# Patient Record
Sex: Female | Born: 1955
Health system: Southern US, Community
[De-identification: ages and names within clinical notes are randomized; demographics above are authoritative.]

## PROBLEM LIST (undated history)

## (undated) DIAGNOSIS — E559 Vitamin D deficiency, unspecified: Secondary | ICD-10-CM

## (undated) DIAGNOSIS — Z8711 Personal history of peptic ulcer disease: Secondary | ICD-10-CM

## (undated) DIAGNOSIS — K219 Gastro-esophageal reflux disease without esophagitis: Secondary | ICD-10-CM

## (undated) DIAGNOSIS — C801 Malignant (primary) neoplasm, unspecified: Secondary | ICD-10-CM

## (undated) DIAGNOSIS — I341 Nonrheumatic mitral (valve) prolapse: Secondary | ICD-10-CM

## (undated) DIAGNOSIS — Z8719 Personal history of other diseases of the digestive system: Secondary | ICD-10-CM

## (undated) DIAGNOSIS — R87619 Unspecified abnormal cytological findings in specimens from cervix uteri: Secondary | ICD-10-CM

## (undated) DIAGNOSIS — N920 Excessive and frequent menstruation with regular cycle: Secondary | ICD-10-CM

## (undated) DIAGNOSIS — N912 Amenorrhea, unspecified: Secondary | ICD-10-CM

## (undated) HISTORY — DX: Amenorrhea, unspecified: N91.2

## (undated) HISTORY — DX: Vitamin D deficiency, unspecified: E55.9

## (undated) HISTORY — DX: Excessive and frequent menstruation with regular cycle: N92.0

## (undated) HISTORY — DX: Nonrheumatic mitral (valve) prolapse: I34.1

## (undated) HISTORY — DX: Personal history of peptic ulcer disease: Z87.11

## (undated) HISTORY — PX: OTHER SURGICAL HISTORY: SHX169

## (undated) HISTORY — DX: Personal history of other diseases of the digestive system: Z87.19

## (undated) HISTORY — DX: Gastro-esophageal reflux disease without esophagitis: K21.9

## (undated) HISTORY — DX: Unspecified abnormal cytological findings in specimens from cervix uteri: R87.619

## (undated) HISTORY — DX: Malignant (primary) neoplasm, unspecified: C80.1

## (undated) HISTORY — PX: TUBAL LIGATION: SHX77

---

## 1997-01-23 DIAGNOSIS — R87619 Unspecified abnormal cytological findings in specimens from cervix uteri: Secondary | ICD-10-CM

## 1997-01-23 HISTORY — PX: GYNECOLOGIC CRYOSURGERY: SHX857

## 1997-01-23 HISTORY — DX: Unspecified abnormal cytological findings in specimens from cervix uteri: R87.619

## 2003-03-24 DIAGNOSIS — N920 Excessive and frequent menstruation with regular cycle: Secondary | ICD-10-CM

## 2003-03-24 HISTORY — PX: ENDOMETRIAL BIOPSY: SHX622

## 2003-03-24 HISTORY — DX: Excessive and frequent menstruation with regular cycle: N92.0

## 2003-03-31 ENCOUNTER — Other Ambulatory Visit: Admission: RE | Admit: 2003-03-31 | Discharge: 2003-03-31 | Payer: Self-pay | Admitting: Obstetrics and Gynecology

## 2003-06-15 ENCOUNTER — Ambulatory Visit (HOSPITAL_BASED_OUTPATIENT_CLINIC_OR_DEPARTMENT_OTHER): Admission: RE | Admit: 2003-06-15 | Discharge: 2003-06-15 | Payer: Self-pay | Admitting: Obstetrics and Gynecology

## 2003-06-15 ENCOUNTER — Ambulatory Visit (HOSPITAL_COMMUNITY): Admission: RE | Admit: 2003-06-15 | Discharge: 2003-06-15 | Payer: Self-pay | Admitting: Obstetrics and Gynecology

## 2003-06-15 HISTORY — PX: ABLATION: SHX5711

## 2004-02-23 ENCOUNTER — Ambulatory Visit: Payer: Self-pay | Admitting: Internal Medicine

## 2004-02-25 ENCOUNTER — Ambulatory Visit: Payer: Self-pay

## 2005-03-17 ENCOUNTER — Other Ambulatory Visit: Admission: RE | Admit: 2005-03-17 | Discharge: 2005-03-17 | Payer: Self-pay | Admitting: Obstetrics and Gynecology

## 2005-05-02 ENCOUNTER — Encounter: Admission: RE | Admit: 2005-05-02 | Discharge: 2005-05-02 | Payer: Self-pay | Admitting: Obstetrics and Gynecology

## 2006-07-18 ENCOUNTER — Encounter: Admission: RE | Admit: 2006-07-18 | Discharge: 2006-07-18 | Payer: Self-pay | Admitting: Obstetrics and Gynecology

## 2007-01-24 DIAGNOSIS — C801 Malignant (primary) neoplasm, unspecified: Secondary | ICD-10-CM

## 2007-01-24 HISTORY — DX: Malignant (primary) neoplasm, unspecified: C80.1

## 2007-07-17 ENCOUNTER — Other Ambulatory Visit: Admission: RE | Admit: 2007-07-17 | Discharge: 2007-07-17 | Payer: Self-pay | Admitting: Obstetrics and Gynecology

## 2007-08-01 ENCOUNTER — Encounter: Admission: RE | Admit: 2007-08-01 | Discharge: 2007-08-01 | Payer: Self-pay | Admitting: Obstetrics and Gynecology

## 2008-08-19 ENCOUNTER — Encounter: Admission: RE | Admit: 2008-08-19 | Discharge: 2008-08-19 | Payer: Self-pay | Admitting: Obstetrics and Gynecology

## 2008-08-21 ENCOUNTER — Encounter: Admission: RE | Admit: 2008-08-21 | Discharge: 2008-08-21 | Payer: Self-pay | Admitting: Obstetrics and Gynecology

## 2009-01-23 HISTORY — PX: COLPOSCOPY: SHX161

## 2009-08-30 ENCOUNTER — Encounter: Admission: RE | Admit: 2009-08-30 | Discharge: 2009-08-30 | Payer: Self-pay | Admitting: Obstetrics and Gynecology

## 2010-02-13 ENCOUNTER — Encounter: Payer: Self-pay | Admitting: Obstetrics and Gynecology

## 2010-06-10 NOTE — Op Note (Signed)
NAMEHELOISE, Misty Odom                            ACCOUNT NO.:  [id number]   MEDICAL RECORD NO.:  [id number]                   PATIENT TYPE:  AMB   LOCATION:  [facility]                                 FACILITY:  [facility]   PHYSICIAN:  Cynthia P. Romine, M.D.             DATE OF BIRTH:  15-Jul-1955   DATE OF PROCEDURE:  06/15/2003  DATE OF DISCHARGE:                                 OPERATIVE REPORT   POSTOPERATIVE DIAGNOSIS:  Menorrhagia.   POSTOPERATIVE DIAGNOSIS:  Menorrhagia.   PROCEDURES:  Endometrial ablation using the HydroTherm Ablator.   SURGEON:  Dr. Aram Beecham Romine   ANESTHESIA:  General by LMA.   ESTIMATED BLOOD LOSS:  Minimal.   COMPLICATIONS:  None.   DESCRIPTION OF PROCEDURE:  The patient was taken to the operating room and  after the induction of adequate anesthesia by LMA, was placed in the dorsal  lithotomy position and prepped and draped in the usual fashion.  The bladder  was drained with a red rubber catheter.  A posterior weighted and anterior  Simms retractor were placed; the cervix was grasped off the anterior lip  with a single-tooth tenaculum.  The cervix was dilated to a #21 Shawnie Pons.  Attempt was made to place the endometrial ablation scope, and the cervix was  not felt to be dilated enough.  At this point, the tenaculum pulled off, and  the tenaculum was re-placed.  The cervix was dilated to a #27, and the scope  was then easily inserted into the endometrial cavity.  Photographic  documentation was taken preoperatively of the endometrial cavity showing the  tubal ostia for documentation of proper positioning of the scope.  The scope  was withdrawn to the level of the internal os.  An endometrial ablation was  carried out in the standard fashion using the endometrial HydroTherm  Ablator.  There were no complications.  Upon completion of the procedure,  photographic documentation was again taken.  The scope was withdrawn.  When  the tenaculum was removed, there was  some brisk bleeding from the tenaculum  site, and 3 sutures of 0 chromic were used to place at the tenaculum site to  control the bleeding.  Hemostasis was achieved, the instruments removed from  the vagina, and the procedure was terminated.  The patient tolerated it well  and went in satisfactory condition to postanesthesia recovery.                                               Cynthia P. Romine, M.D.    CPR/MEDQ  D:  06/15/2003  T:  06/15/2003  Job:  [id number]

## 2010-08-01 ENCOUNTER — Other Ambulatory Visit: Payer: Self-pay | Admitting: Obstetrics and Gynecology

## 2010-08-01 DIAGNOSIS — Z1231 Encounter for screening mammogram for malignant neoplasm of breast: Secondary | ICD-10-CM

## 2010-09-07 ENCOUNTER — Ambulatory Visit
Admission: RE | Admit: 2010-09-07 | Discharge: 2010-09-07 | Disposition: A | Discharge status: Home / Self Care | Payer: BC Managed Care – PPO | Source: Ambulatory Visit | Attending: Obstetrics and Gynecology | Admitting: Obstetrics and Gynecology

## 2010-09-07 DIAGNOSIS — Z1231 Encounter for screening mammogram for malignant neoplasm of breast: Secondary | ICD-10-CM

## 2011-08-01 ENCOUNTER — Other Ambulatory Visit: Payer: Self-pay | Admitting: Obstetrics and Gynecology

## 2011-08-01 DIAGNOSIS — Z1231 Encounter for screening mammogram for malignant neoplasm of breast: Secondary | ICD-10-CM

## 2011-09-08 ENCOUNTER — Ambulatory Visit
Admission: RE | Admit: 2011-09-08 | Discharge: 2011-09-08 | Disposition: A | Discharge status: Home / Self Care | Payer: BC Managed Care – PPO | Source: Ambulatory Visit | Attending: Obstetrics and Gynecology | Admitting: Obstetrics and Gynecology

## 2011-09-08 DIAGNOSIS — Z1231 Encounter for screening mammogram for malignant neoplasm of breast: Secondary | ICD-10-CM

## 2012-10-26 ENCOUNTER — Other Ambulatory Visit: Payer: Self-pay | Admitting: Obstetrics and Gynecology

## 2012-11-19 ENCOUNTER — Other Ambulatory Visit: Payer: Self-pay

## 2012-11-19 DIAGNOSIS — Z1231 Encounter for screening mammogram for malignant neoplasm of breast: Secondary | ICD-10-CM

## 2012-12-10 ENCOUNTER — Ambulatory Visit: Payer: BC Managed Care – PPO

## 2012-12-23 ENCOUNTER — Ambulatory Visit
Admission: RE | Admit: 2012-12-23 | Discharge: 2012-12-23 | Disposition: A | Discharge status: Home / Self Care | Payer: BC Managed Care – PPO | Source: Ambulatory Visit

## 2012-12-23 DIAGNOSIS — Z1231 Encounter for screening mammogram for malignant neoplasm of breast: Secondary | ICD-10-CM

## 2013-01-10 ENCOUNTER — Encounter: Payer: Self-pay | Admitting: Obstetrics and Gynecology

## 2013-02-28 ENCOUNTER — Other Ambulatory Visit: Payer: Self-pay | Admitting: *Deleted

## 2013-02-28 MED ORDER — PROGESTERONE MICRONIZED 100 MG PO CAPS: 100.0000 mg | ORAL_CAPSULE | Freq: Every day | ORAL | Status: DC

## 2013-02-28 NOTE — Telephone Encounter (Signed)
eScribe request from [facility] for refill on PROGESTERONE Last filled - 03/01/12 X 1 YEAR Last AEX - 03/01/12 Next AEX - 03/10/13 MMG - 12/23/12, normal RX sent.

## 2013-03-05 ENCOUNTER — Ambulatory Visit: Payer: Self-pay | Admitting: Obstetrics and Gynecology

## 2013-03-06 ENCOUNTER — Encounter: Payer: Self-pay | Admitting: Obstetrics and Gynecology

## 2013-03-10 ENCOUNTER — Encounter: Payer: Self-pay | Admitting: Obstetrics and Gynecology

## 2013-03-10 ENCOUNTER — Ambulatory Visit (INDEPENDENT_AMBULATORY_CARE_PROVIDER_SITE_OTHER): Payer: BC Managed Care – PPO | Admitting: Obstetrics and Gynecology

## 2013-03-10 VITALS — BP 113/76 | HR 68 | Resp 14 | Ht 65.0 in | Wt 145.0 lb

## 2013-03-10 DIAGNOSIS — Z01419 Encounter for gynecological examination (general) (routine) without abnormal findings: Secondary | ICD-10-CM

## 2013-03-10 DIAGNOSIS — Z0000 Encounter for general adult medical examination without abnormal findings: Secondary | ICD-10-CM

## 2013-03-10 LAB — POCT URINALYSIS DIPSTICK
Bilirubin, UA: NEGATIVE
Blood, UA: NEGATIVE
Glucose, UA: NEGATIVE
Ketones, UA: NEGATIVE
LEUKOCYTES UA: NEGATIVE
NITRITE UA: NEGATIVE
Protein, UA: NEGATIVE
Urobilinogen, UA: NEGATIVE
pH, UA: 5

## 2013-03-10 LAB — HEMOGLOBIN, FINGERSTICK: HEMOGLOBIN, FINGERSTICK: 14.2 g/dL (ref 12.0–16.0)

## 2013-03-10 MED ORDER — ESTRADIOL 0.0375 MG/24HR TD PTTW: 1.0000 | MEDICATED_PATCH | TRANSDERMAL | Status: DC

## 2013-03-10 MED ORDER — PROGESTERONE MICRONIZED 100 MG PO CAPS: 100.0000 mg | ORAL_CAPSULE | Freq: Every day | ORAL | Status: DC

## 2013-03-10 NOTE — Progress Notes (Signed)
GYNECOLOGY VISIT  PCP: [place]  Referring provider:   HPI: 58 y.o.   Married  Caucasian  female   G2P2 with No LMP recorded. Patient is postmenopausal.   here for  Annual Exam  On HRT for four years. Started for hot flashes.  I feel human on hormone therapy.  No vagina dryness.  Became a grandmother.   Hgb:  14.2, labs with PCP.  Urine:  Neg  GYNECOLOGIC HISTORY: No LMP recorded. Patient is postmenopausal. Sexually active:  yes Partner preference: female Contraception:   BTL, Menopausal Menopausal hormone therapy: yes    Vivelle Dot, Prometrium DES exposure:   no Blood transfusions:   no Sexually transmitted diseases:   no GYN Procedures:  Ablation (HTA)  Colpo/BX 02/23/2010 - squamous epithelium with HPV effect, 01/27/2009 - LGSIL, ECC with fragment of atypia. Mammogram:   12/23/2012 Neg              Pap:   03/01/12 Neg  HR HPV Neg History of abnormal pap smear:  12/2008 LSIL  HPV/Mild Dysplasia/CIN 1, 01/26/2010  LSIL.  Remote history of cryotherapy 1999.    OB History   Grav Para Term Preterm Abortions TAB SAB Ect Mult Living   2 2        2        LIFESTYLE: Exercise:    Runner 3 miles 3 days a week, Yoga             Tobacco: no Alcohol: 1-2 drinks a week (Alcohol or Wine) Drug use:  no  OTHER HEALTH MAINTENANCE: Tetanus/TDap: 12/23/2008 Gardisil: no Influenza:  No, declines.  Zostavax: no  Bone density: never Colonoscopy: 02/2008 normal, repeat in 10years  Cholesterol check: no  Family History  Problem Relation Age of Onset  . Hypertension Mother   . Anemia Mother   . Heart disease Father     There are no active problems to display for this patient.  Past Medical History  Diagnosis Date  . MVP (mitral valve prolapse)   . Abnormal Pap smear of cervix 1999    Cryo  . GERD (gastroesophageal reflux disease)   . Cancer 2009    Sq cell cancer Left face  . Amenorrhea   . Menorrhagia 3/05    Past Surgical History  Procedure Laterality Date   . Gynecologic cryosurgery  1999  . Ablation  06/15/03    HTA    . Colposcopy  01/2009    CIN 1  . Tubal ligation      BTL  . Endometrial biopsy  03/2003    benign  . Birth mark removed      foot    ALLERGIES: Review of patient's allergies indicates no known allergies.  Current Outpatient Prescriptions  Medication Sig Dispense Refill  . Cholecalciferol (VITAMIN D) 2000 UNITS tablet Take 2,000 Units by mouth daily.      . cycloSPORINE (RESTASIS) 0.05 % ophthalmic emulsion 1 drop 2 (two) times daily.      Marland Kitchen dexlansoprazole (DEXILANT) 60 MG capsule Take 60 mg by mouth 2 (two) times a week.      . estradiol (VIVELLE-DOT) 0.0375 MG/24HR Place 1 patch onto the skin 2 (two) times a week.      . progesterone (PROMETRIUM) 100 MG capsule Take 1 capsule (100 mg total) by mouth daily.  90 capsule  0  . Cetirizine HCl (ZYRTEC PO) Take by mouth daily.       No current facility-administered medications for this visit.  ROS:  Pertinent items are noted in HPI.  SOCIAL HISTORY:  At home.   PHYSICAL EXAMINATION:    BP 113/76  Pulse 68  Resp 14  Ht 5\' 5"  (1.651 m)  Wt 145 lb (65.772 kg)  BMI 24.13 kg/m2   Wt Readings from Last 3 Encounters:  03/10/13 145 lb (65.772 kg)     Ht Readings from Last 3 Encounters:  03/10/13 5\' 5"  (1.651 m)    General appearance: alert, cooperative and appears stated age Head: Normocephalic, without obvious abnormality, atraumatic Neck: no adenopathy, supple, symmetrical, trachea midline and thyroid not enlarged, symmetric, no tenderness/mass/nodules Lungs: clear to auscultation bilaterally Breasts: Inspection negative, No nipple retraction or dimpling, No nipple discharge or bleeding, No axillary or supraclavicular adenopathy, Normal to palpation without dominant masses Heart: regular rate and rhythm Abdomen: soft, non-tender; no masses,  no organomegaly Extremities: extremities normal, atraumatic, no cyanosis or edema Skin: Skin color, texture, turgor  normal. No rashes or lesions Lymph nodes: Cervical, supraclavicular, and axillary nodes normal. No abnormal inguinal nodes palpated Neurologic: Grossly normal  Pelvic: External genitalia:  no lesions              Urethra:  normal appearing urethra with no masses, tenderness or lesions              Bartholins and Skenes: normal                 Vagina: normal appearing vagina with normal color and discharge, no lesions              Cervix: normal appearance              Pap and high risk HPV testing done: yes.            Bimanual Exam:  Uterus:  uterus is normal size, shape, consistency and nontender                                      Adnexa: normal adnexa in size, nontender and no masses                                      Rectovaginal: Confirms                                      Anus:  normal sphincter tone, no lesions  ASSESSMENT  Normal gynecologic exam. History of LGSIL in 2012.   History of prior cryotherapy.   PLAN  Mammogram yearly.  Pap smear and high risk HPV testing performed today.  Counseled on use and side effects of HRT, diet and exercise.  Discussed risks of HRT - Stroke, MI, DVT, PE, breast cancer.  Continue with Vivelle 0.0375 twice weekly and Prometrium 100 mg daily.  Medications per Epic orders Return annually or prn   An After Visit Summary was printed and given to the patient.

## 2013-03-10 NOTE — Patient Instructions (Signed)

## 2013-03-14 LAB — IPS PAP TEST WITH HPV

## 2013-11-17 ENCOUNTER — Telehealth: Payer: Self-pay | Admitting: Obstetrics and Gynecology

## 2013-11-17 NOTE — Telephone Encounter (Signed)
lmtcb to reschedule aex from 03/16/14 to another day

## 2013-11-24 ENCOUNTER — Encounter: Payer: Self-pay | Admitting: Obstetrics and Gynecology

## 2014-01-26 ENCOUNTER — Other Ambulatory Visit: Payer: Self-pay

## 2014-01-26 DIAGNOSIS — Z1231 Encounter for screening mammogram for malignant neoplasm of breast: Secondary | ICD-10-CM

## 2014-02-04 ENCOUNTER — Ambulatory Visit
Admission: RE | Admit: 2014-02-04 | Discharge: 2014-02-04 | Disposition: A | Discharge status: Home / Self Care | Payer: BLUE CROSS/BLUE SHIELD | Source: Ambulatory Visit

## 2014-02-04 DIAGNOSIS — Z1231 Encounter for screening mammogram for malignant neoplasm of breast: Secondary | ICD-10-CM

## 2014-03-16 ENCOUNTER — Ambulatory Visit: Payer: BC Managed Care – PPO | Admitting: Obstetrics and Gynecology

## 2014-03-16 ENCOUNTER — Encounter: Payer: Self-pay | Admitting: Certified Nurse Midwife

## 2014-03-16 ENCOUNTER — Ambulatory Visit (INDEPENDENT_AMBULATORY_CARE_PROVIDER_SITE_OTHER): Payer: BLUE CROSS/BLUE SHIELD | Admitting: Certified Nurse Midwife

## 2014-03-16 VITALS — BP 128/80 | HR 72 | Resp 16 | Ht 65.0 in | Wt 147.0 lb

## 2014-03-16 DIAGNOSIS — N951 Menopausal and female climacteric states: Secondary | ICD-10-CM

## 2014-03-16 DIAGNOSIS — Z01419 Encounter for gynecological examination (general) (routine) without abnormal findings: Secondary | ICD-10-CM

## 2014-03-16 DIAGNOSIS — N390 Urinary tract infection, site not specified: Secondary | ICD-10-CM

## 2014-03-16 DIAGNOSIS — Z0000 Encounter for general adult medical examination without abnormal findings: Secondary | ICD-10-CM

## 2014-03-16 LAB — POCT URINALYSIS DIPSTICK
BILIRUBIN UA: NEGATIVE
Glucose, UA: NEGATIVE
Ketones, UA: NEGATIVE
Nitrite, UA: NEGATIVE
Protein, UA: NEGATIVE
UROBILINOGEN UA: NEGATIVE
pH, UA: 5

## 2014-03-16 MED ORDER — ESTRADIOL 0.0375 MG/24HR TD PTTW: 1.0000 | MEDICATED_PATCH | TRANSDERMAL | Status: DC

## 2014-03-16 MED ORDER — PROGESTERONE MICRONIZED 100 MG PO CAPS: 100.0000 mg | ORAL_CAPSULE | Freq: Every day | ORAL | Status: DC

## 2014-03-16 NOTE — Progress Notes (Signed)
59 y.o. G2P2 Married  Caucasian Fe here for annual exam. Menopausal on HRT. Would like to discuss HRT and length of time she needs to continue. Sees PCP prn,? Labs last visit will check.Denies any vaginal bleeding or vaginal dryness. Denies any urinary frequency, urgency or pain. No other health issues today.  No LMP recorded. Patient is postmenopausal.          Sexually active: Yes.    The current method of family planning is tubal ligation and post menopausal status.    Exercising: Yes.    Run and yoga Smoker:  no  Health Maintenance: Pap:  03/10/13 Neg. HR HPV:neg Hx of Abnormal pap and colposcopy 2011 MMG:  02/05/14 BIRADS1:neg Self Breast Exam: No Colonoscopy:  02/2008 BMD:   none TDaP:  2010 Labs: UA: WBC=Trace, RBC=Trace Ph=5.0 Hg:14.2   reports that she has never smoked. She has never used smokeless tobacco. She reports that she drinks about 1.0 oz of alcohol per week. She reports that she does not use illicit drugs.  Past Medical History  Diagnosis Date  . MVP (mitral valve prolapse)   . Abnormal Pap smear of cervix 1999    Cryo  . GERD (gastroesophageal reflux disease)   . Cancer 2009    Sq cell cancer Left face  . Amenorrhea   . Menorrhagia 3/05    Past Surgical History  Procedure Laterality Date  . Gynecologic cryosurgery  1999  . Ablation  06/15/03    HTA    . Colposcopy  01/2009    CIN 1  . Tubal ligation      BTL  . Endometrial biopsy  03/2003    benign  . Birth mark removed      foot    Current Outpatient Prescriptions  Medication Sig Dispense Refill  . cycloSPORINE (RESTASIS) 0.05 % ophthalmic emulsion 1 drop 2 (two) times daily.    Marland Kitchen dexlansoprazole (DEXILANT) 60 MG capsule Take 60 mg by mouth 2 (two) times a week.    . estradiol (VIVELLE-DOT) 0.0375 MG/24HR Place 1 patch onto the skin 2 (two) times a week. 8 patch 11  . progesterone (PROMETRIUM) 100 MG capsule Take 1 capsule (100 mg total) by mouth daily. 90 capsule 3  . tetracycline  (ACHROMYCIN,SUMYCIN) 500 MG capsule Take 500 mg by mouth every other day.     No current facility-administered medications for this visit.    Family History  Problem Relation Age of Onset  . Hypertension Mother   . Anemia Mother   . Heart disease Mother   . Heart disease Father   . Cancer Brother     Satge 4 Sarcoma    ROS:  Pertinent items are noted in HPI.  Otherwise, a comprehensive ROS was negative.  Exam:   BP 128/80 mmHg  Pulse 72  Resp 16  Ht 5\' 5"  (1.651 m)  Wt 147 lb (66.679 kg)  BMI 24.46 kg/m2 Height: 5\' 5"  (165.1 cm) Ht Readings from Last 3 Encounters:  03/16/14 5\' 5"  (1.651 m)  03/10/13 5\' 5"  (1.651 m)    General appearance: alert, cooperative and appears stated age Head: Normocephalic, without obvious abnormality, atraumatic Neck: no adenopathy, supple, symmetrical, trachea midline and thyroid normal to inspection and palpation Lungs: clear to auscultation bilaterally Breasts: normal appearance, no masses or tenderness, No nipple retraction or dimpling, No nipple discharge or bleeding, No axillary or supraclavicular adenopathy Heart: regular rate and rhythm Abdomen: soft, non-tender; no masses,  no organomegaly Extremities: extremities normal, atraumatic, no  cyanosis or edema Skin: Skin color, texture, turgor normal. No rashes or lesions Lymph nodes: Cervical, supraclavicular, and axillary nodes normal. No abnormal inguinal nodes palpated Neurologic: Grossly normal   Pelvic: External genitalia:  no lesions              Urethra:  normal appearing urethra with no masses, tenderness or lesions, bladder non tender              Bartholin's and Skene's: normal                 Vagina: normal appearing vagina with normal color and discharge, no lesions              Cervix: normal, non tender, no lesions              Pap taken: No. Bimanual Exam:  Uterus:  normal size, contour, position, consistency, mobility, non-tender              Adnexa: normal adnexa and no  mass, fullness, tenderness               Rectovaginal: Confirms               Anus:  normal sphincter tone, no lesions  Chaperone present: Yes  A:  Well Woman with normal exam  Menopausal on HRT desires continuance  R/O UTI + RBC    P:   Reviewed health and wellness pertinent to exam  Aware of need to evaluate if vaginal bleeding. Discussed risks/benefits of HRT, Patient would like to continue. Discussed plan to wean off by age 62 unless health status change. She will notify if occurs.  Pap smear not taken today  Lab: Urine micro   counseled on breast self exam, mammography screening, use and side effects of HRT, adequate intake of calcium and vitamin D, diet and exercise  return annually or prn  An After Visit Summary was printed and given to the patient.

## 2014-03-16 NOTE — Patient Instructions (Signed)

## 2014-03-17 LAB — URINALYSIS, MICROSCOPIC ONLY
Bacteria, UA: NONE SEEN
Casts: NONE SEEN
Crystals: NONE SEEN

## 2014-03-17 LAB — HEMOGLOBIN, FINGERSTICK: HEMOGLOBIN, FINGERSTICK: 14.2 g/dL (ref 12.0–16.0)

## 2014-03-17 NOTE — Progress Notes (Signed)
Encounter reviewed by Dr. Darby Shadwick Silva.  

## 2014-04-11 ENCOUNTER — Other Ambulatory Visit: Payer: Self-pay | Admitting: Obstetrics and Gynecology

## 2014-04-13 NOTE — Telephone Encounter (Signed)
Medication refill request: Minivelle patch Last AEX:  03/16/14 DL Next AEX: 03/24/15 Dr. Quincy Simmonds Last MMG (if hormonal medication request): 02/05/14 BIRADS1:Neg Refill authorized: 03/16/14 #8patch/12R to [facility]

## 2015-03-22 ENCOUNTER — Other Ambulatory Visit: Payer: Self-pay | Admitting: Certified Nurse Midwife

## 2015-03-22 NOTE — Telephone Encounter (Signed)
Medication refill request: Minivelle Last AEX:  03-16-14 Next AEX: 03-24-15 Last MMG (if hormonal medication request): 02-04-14 WNL Refill authorized: please advise

## 2015-03-23 NOTE — Telephone Encounter (Signed)
Has aex tomorrow and mm not current will discuss with patient

## 2015-03-24 ENCOUNTER — Telehealth: Payer: Self-pay

## 2015-03-24 ENCOUNTER — Encounter: Payer: Self-pay | Admitting: Obstetrics and Gynecology

## 2015-03-24 ENCOUNTER — Ambulatory Visit (INDEPENDENT_AMBULATORY_CARE_PROVIDER_SITE_OTHER): Payer: BLUE CROSS/BLUE SHIELD | Admitting: Obstetrics and Gynecology

## 2015-03-24 VITALS — BP 114/70 | HR 76 | Resp 14 | Ht 65.0 in | Wt 146.8 lb

## 2015-03-24 DIAGNOSIS — Z01419 Encounter for gynecological examination (general) (routine) without abnormal findings: Secondary | ICD-10-CM | POA: Diagnosis not present

## 2015-03-24 DIAGNOSIS — N951 Menopausal and female climacteric states: Secondary | ICD-10-CM

## 2015-03-24 DIAGNOSIS — Z79890 Hormone replacement therapy (postmenopausal): Secondary | ICD-10-CM

## 2015-03-24 MED ORDER — ESTRADIOL 0.0375 MG/24HR TD PTTW: 1.0000 | MEDICATED_PATCH | TRANSDERMAL | Status: DC

## 2015-03-24 MED ORDER — PROGESTERONE MICRONIZED 100 MG PO CAPS: 100.0000 mg | ORAL_CAPSULE | Freq: Every day | ORAL | Status: DC

## 2015-03-24 NOTE — Progress Notes (Signed)
Patient ID: Misty Odom, female   DOB: [birth year], 60 y.o.   MRN: [id number] 60 y.o. G2P2 Married Caucasian female here for annual exam.    Patient is on Vivelle Dot and Prometrium for HRT.  Has been taking for 5 years.  Likes it and wants to continue.  Going to [place].  Daughter is having her second baby.  Other daughter is teaching in the [place] area.  Helping to care for her 66 yo mother.   Had a maternal aunt with hx of breast cancer at age 68 - 21 yo.  PCP:  Donald Prose, MD   No LMP recorded. Patient is postmenopausal.         Had an ablation 06/15/03.  No bleeding since then.  Sexually active: Yes.   female The current method of family planning is tubal ligation.    Exercising: Yes.    runs, works out at gym and yoga. Smoker:  no  Health Maintenance: Pap:  03-10-13 Neg:Neg HR HPV History of abnormal Pap:  Yes, 12-24-10 colpo bx - squamous epithelium w/HPV effect, 01-27-09 colpo bx - LGSIL, ECC with fragment of atypia. 01-26-10 LSIL pap and 12/2008 pap LSIL. 1999 remote hx of cryotherapy to cervix. MMG:  02-04-14 fibroglandular density B/Neg:[facility].  Patient will schedule. Colonoscopy: 2 weeks ago polyp with [facility];next due 02/2018. BMD:   n/a  Result  n/a TDaP:  12-23-2008 Screening Labs:  Hb today: PCP, Urine today: unable to void   reports that she has never smoked. She has never used smokeless tobacco. She reports that she drinks about 1.0 oz of alcohol per week. She reports that she does not use illicit drugs.  Past Medical History  Diagnosis Date  . MVP (mitral valve prolapse)   . Abnormal Pap smear of cervix 1999    Cryo  . GERD (gastroesophageal reflux disease)   . Cancer [facility]) 2009    Sq cell cancer Left face  . Amenorrhea   . Menorrhagia 3/05    Past Surgical History  Procedure Laterality Date  . Gynecologic cryosurgery  1999  . Ablation  06/15/03    HTA    . Colposcopy  01/2009    CIN 1  . Tubal ligation      BTL  . Endometrial biopsy  03/2003     benign  . Birth mark removed      foot    Current Outpatient Prescriptions  Medication Sig Dispense Refill  . cycloSPORINE (RESTASIS) 0.05 % ophthalmic emulsion 1 drop 2 (two) times daily.    Marland Kitchen estradiol (VIVELLE-DOT) 0.0375 MG/24HR Place 1 patch onto the skin 2 (two) times a week. 8 patch 12  . progesterone (PROMETRIUM) 100 MG capsule Take 1 capsule (100 mg total) by mouth daily. 90 capsule 4  . omeprazole (PRILOSEC) 40 MG capsule Take 1 capsule by mouth daily.  5  . sucralfate (CARAFATE) 1 g tablet Take 1 tablet by mouth 2 (two) times daily.  1  . tetracycline (ACHROMYCIN,SUMYCIN) 500 MG capsule Take 500 mg by mouth every other day. Reported on 03/24/2015     No current facility-administered medications for this visit.    Family History  Problem Relation Age of Onset  . Hypertension Mother   . Anemia Mother   . Heart disease Mother   . Heart disease Father   . Cancer Brother     Satge 4 Sarcoma    ROS:  Pertinent items are noted in HPI.  Otherwise, a comprehensive ROS  was negative.  Exam:   There were no vitals taken for this visit.    General appearance: alert, cooperative and appears stated age Head: Normocephalic, without obvious abnormality, atraumatic Neck: no adenopathy, supple, symmetrical, trachea midline and thyroid normal to inspection and palpation Lungs: clear to auscultation bilaterally Breasts: normal appearance, no masses or tenderness, Inspection negative, No nipple retraction or dimpling, No nipple discharge or bleeding, No axillary or supraclavicular adenopathy Heart: regular rate and rhythm Abdomen: soft, non-tender; bowel sounds normal; no masses,  no organomegaly Extremities: extremities normal, atraumatic, no cyanosis or edema Skin: Skin color, texture, turgor normal. No rashes or lesions Lymph nodes: Cervical, supraclavicular, and axillary nodes normal. No abnormal inguinal nodes palpated Neurologic: Grossly normal  Pelvic: External genitalia:   no lesions              Urethra:  normal appearing urethra with no masses, tenderness or lesions              Bartholins and Skenes: normal                 Vagina: normal appearing vagina with normal color and discharge, no lesions              Cervix: no lesions              Pap taken: No. Bimanual Exam:  Uterus:  normal size, contour, position, consistency, mobility, non-tender              Adnexa: normal adnexa and no mass, fullness, tenderness              Rectovaginal: Yes.  .  Confirms.              Anus:  normal sphincter tone, no lesions  Chaperone was present for exam.  Assessment:   Well woman visit with normal exam. HRT patient.   Plan: Yearly mammogram recommended after age 74. Patient will call to schedule this.   Recommended self breast exam.  Pap and HR HPV as above.  Will do pap next year.  Discussed Calcium, Vitamin D, regular exercise program including cardiovascular and weight bearing exercise. Labs performed.  No..   Refills given on medications.  Yes.  .  See orders.  Prometrium 100 mg daily and Minivelle 0.0375 mg twice weekly.  I gave Rx for one month for each of theses.  Discussed benefits and risks of DVT, PE, MI, stroke and breast cancer.  I will refill her HRT for one year after she has had her mammogram return and it is normal. Congratulations on being a grandmother again! Follow up annually and prn.      After visit summary provided.

## 2015-03-24 NOTE — Telephone Encounter (Signed)
Patient says she can be here by 11:15 only.

## 2015-03-24 NOTE — Telephone Encounter (Signed)
Called pt. Regarding 11:30am appt. For today and want to have her arrive at 11:00 or by 11:10am.  LMOVM to call me back to discuss.

## 2015-03-24 NOTE — Patient Instructions (Signed)

## 2015-03-25 ENCOUNTER — Other Ambulatory Visit: Payer: Self-pay

## 2015-03-25 DIAGNOSIS — Z1231 Encounter for screening mammogram for malignant neoplasm of breast: Secondary | ICD-10-CM

## 2015-04-19 ENCOUNTER — Ambulatory Visit
Admission: RE | Admit: 2015-04-19 | Discharge: 2015-04-19 | Disposition: A | Discharge status: Home / Self Care | Payer: BLUE CROSS/BLUE SHIELD | Source: Ambulatory Visit

## 2015-04-19 DIAGNOSIS — Z1231 Encounter for screening mammogram for malignant neoplasm of breast: Secondary | ICD-10-CM

## 2015-05-10 ENCOUNTER — Other Ambulatory Visit: Payer: Self-pay | Admitting: Obstetrics and Gynecology

## 2015-05-10 NOTE — Telephone Encounter (Signed)
Medication refill request: Estradiol 0.0375mg /24HR Last AEX:  03/24/15 Dr. Quincy Simmonds Next AEX: 04/12/16 Last MMG (if hormonal medication request): 04/19/15 BIRADS1 negative  Refill authorized: 03/24/15 #4 w/0 refills today please adivse

## 2015-05-21 ENCOUNTER — Other Ambulatory Visit: Payer: Self-pay | Admitting: Certified Nurse Midwife

## 2015-05-21 NOTE — Telephone Encounter (Signed)
Medication refill request: Progesterone (Prometrium)100mg  Last AEX:  03/24/15 Dr. Quincy Simmonds Next AEX: 03/24/16 Last MMG (if hormonal medication request): 04/19/15 BIRADS1 negative Refill authorized: 03/24/15 #30 w/ 0 refills today please advise

## 2016-02-22 ENCOUNTER — Other Ambulatory Visit: Payer: Self-pay | Admitting: Certified Nurse Midwife

## 2016-02-22 NOTE — Telephone Encounter (Signed)
Medication refill request: Progesterone Last AEX:  03/24/15 BS Next AEX: 04/12/16 BS Last MMG (if hormonal medication request): 04/19/15 BIRADS1, Density B Refill authorized: 05/21/15 #90 2R. Please advise. Thank you.

## 2016-03-23 DIAGNOSIS — E559 Vitamin D deficiency, unspecified: Secondary | ICD-10-CM

## 2016-03-23 HISTORY — DX: Vitamin D deficiency, unspecified: E55.9

## 2016-03-30 ENCOUNTER — Other Ambulatory Visit: Payer: Self-pay | Admitting: Obstetrics and Gynecology

## 2016-03-30 DIAGNOSIS — Z1231 Encounter for screening mammogram for malignant neoplasm of breast: Secondary | ICD-10-CM

## 2016-04-10 NOTE — Progress Notes (Signed)
61 y.o. G2P2 Married Caucasian female here for annual exam.    Wants to continue HRT.  Still has some hot flashes.  Bottom is itching.   Feels her abdomen is getting bigger. Hard to stay in shape!  Wants labs.  Taking care of her elderly mother who is 49 yo.  Brother died of sarcoma.   Family doing a reunion in [place] area.  PCP: Donald Prose, MD  Patient's last menstrual period was 01/24/2003 (approximate).           Sexually active: Yes.   female The current method of family planning is tubal ligation.    Exercising: Yes.    Gym and yoga Smoker:  no  Health Maintenance: Pap: 03-10-13 Neg:Neg HR HPV;03-01-12 Neg:Neg HR HPV History of abnormal Pap:  Yes,12-24-10 colpo bx - squamous epithelium w/HPV effect, 01-27-09 colpo bx - LGSIL, ECC with fragment of atypia. 01-26-10 LSIL pap and 12/2008 pap LSIL. 1999 remote hx of cryotherapy to cervix.  MMG: 04-09-15 Density B/Neg/BiRads1:TBC--appt. 05-15-16 Colonoscopy:02/2015 polyp with [facility];next due 02/2018. BMD:   n/a  Result  n/a TDaP:  12-24-2008 Gardasil:   N/A HIV: Today Hep C:  Today. Screening Labs:  Hb today: 14.5, Urine today: trace RBCs.  This was not a clean catch.   reports that she has never smoked. She has never used smokeless tobacco. She reports that she drinks about 1.0 oz of alcohol per week . She reports that she does not use drugs.  Past Medical History:  Diagnosis Date  . Abnormal Pap smear of cervix 1999   Cryo  . Amenorrhea   . Cancer [facility]) 2009   Sq cell cancer Left face  . GERD (gastroesophageal reflux disease)   . H/O gastric ulcer   . Menorrhagia 3/05  . MVP (mitral valve prolapse)     Past Surgical History:  Procedure Laterality Date  . ABLATION  06/15/03   HTA    . Birth Mark Removed     foot  . COLPOSCOPY  01/2009   CIN 1  . ENDOMETRIAL BIOPSY  03/2003   benign  . GYNECOLOGIC CRYOSURGERY  1999  . TUBAL LIGATION     BTL    Current Outpatient Prescriptions  Medication Sig Dispense  Refill  . cycloSPORINE (RESTASIS) 0.05 % ophthalmic emulsion 1 drop 2 (two) times daily.    Marland Kitchen DEXILANT 60 MG capsule Take 1 tablet by mouth daily. Patient takes qod  11  . estradiol (VIVELLE-DOT) 0.0375 MG/24HR APPLY 1 PATCH EXTERNALLY TO THE SKIN 2 TIMES A WEEK 8 patch 10  . progesterone (PROMETRIUM) 100 MG capsule TAKE 1 CAPSULE BY MOUTH DAILY 90 capsule 0  . tetracycline (ACHROMYCIN,SUMYCIN) 500 MG capsule Take 500 mg by mouth as needed. Reported on 03/24/2015--prn for Rosacae     No current facility-administered medications for this visit.     Family History  Problem Relation Age of Onset  . Hypertension Mother   . Anemia Mother   . Heart disease Mother   . Heart disease Father   . Cancer Brother     Lia Hopping  4 Sarcoma--Dec age 53  . Breast cancer Maternal Aunt     ROS:  Pertinent items are noted in HPI.  Otherwise, a comprehensive ROS was negative.  Exam:   BP 122/72 (BP Location: Right Arm, Patient Position: Sitting, Cuff Size: Normal)   Pulse 80   Resp 16   Ht 5' 4.5" (1.638 m)   Wt 147 lb 6.4 oz (66.9 kg)  LMP 01/24/2003 (Approximate)   BMI 24.91 kg/m     General appearance: alert, cooperative and appears stated age Head: Normocephalic, without obvious abnormality, atraumatic Neck: no adenopathy, supple, symmetrical, trachea midline and thyroid normal to inspection and palpation Lungs: clear to auscultation bilaterally Breasts: normal appearance, no masses or tenderness, No nipple retraction or dimpling, No nipple discharge or bleeding, No axillary or supraclavicular adenopathy Heart: regular rate and rhythm Abdomen: soft, non-tender; no masses, no organomegaly Extremities: extremities normal, atraumatic, no cyanosis or edema Skin: Skin color, texture, turgor normal. No rashes or lesions Lymph nodes: Cervical, supraclavicular, and axillary nodes normal. No abnormal inguinal nodes palpated Neurologic: Grossly normal  Pelvic: External genitalia:  no lesions               Urethra:  normal appearing urethra with no masses, tenderness or lesions              Bartholins and Skenes: normal                 Vagina: normal appearing vagina with normal color and discharge, no lesions              Cervix: no lesions              Pap taken: Yes.   Bimanual Exam:  Uterus:  normal size, contour, position, consistency, mobility, non-tender              Adnexa: no mass, fullness, tenderness              Rectal exam: Yes.  .  Confirms.              Anus:  normal sphincter tone, no lesions  Chaperone was present for exam.  Assessment:   Well woman visit with normal exam. Hx LGSIL. HRT patient.  Status post endometrial ablation.  Perianal itching.  No lesions seen. Reflux.  Plan: Mammogram screening discussed. Recommended self breast awareness. Pap and HR HPV as above. Guidelines for Calcium, Vitamin D, regular exercise program including cardiovascular and weight bearing exercise. Discussed Tucks pads to anal area.   She will wean off HRT.  She will cut her estrogen patch in half and continue with the Prometrium 100 mg daily.  I did give her refills for 90 days. We discussed WHI study and benefits of HRT and risks of breast cancer, DVT, PE, MI and stroke. BMD ordered.  She will try to do the same day as her mammogram. Routine labs done including HIV and hep C.  Follow up annually and prn.      After visit summary provided.

## 2016-04-12 ENCOUNTER — Other Ambulatory Visit: Payer: Self-pay | Admitting: Obstetrics and Gynecology

## 2016-04-12 ENCOUNTER — Ambulatory Visit (INDEPENDENT_AMBULATORY_CARE_PROVIDER_SITE_OTHER): Payer: BLUE CROSS/BLUE SHIELD | Admitting: Obstetrics and Gynecology

## 2016-04-12 ENCOUNTER — Encounter: Payer: Self-pay | Admitting: Obstetrics and Gynecology

## 2016-04-12 VITALS — BP 122/72 | HR 80 | Resp 16 | Ht 64.5 in | Wt 147.4 lb

## 2016-04-12 DIAGNOSIS — Z119 Encounter for screening for infectious and parasitic diseases, unspecified: Secondary | ICD-10-CM

## 2016-04-12 DIAGNOSIS — Z0000 Encounter for general adult medical examination without abnormal findings: Secondary | ICD-10-CM | POA: Diagnosis not present

## 2016-04-12 DIAGNOSIS — Z01419 Encounter for gynecological examination (general) (routine) without abnormal findings: Secondary | ICD-10-CM | POA: Diagnosis not present

## 2016-04-12 DIAGNOSIS — E559 Vitamin D deficiency, unspecified: Secondary | ICD-10-CM

## 2016-04-12 DIAGNOSIS — Z780 Asymptomatic menopausal state: Secondary | ICD-10-CM | POA: Diagnosis not present

## 2016-04-12 DIAGNOSIS — Z1151 Encounter for screening for human papillomavirus (HPV): Secondary | ICD-10-CM | POA: Diagnosis not present

## 2016-04-12 LAB — LIPID PANEL
Cholesterol: 195 mg/dL (ref <200)
HDL: 77 mg/dL (ref >50)
LDL Cholesterol: 107 mg/dL — ABNORMAL HIGH (ref <100)
TRIGLYCERIDES: 57 mg/dL (ref <150)
Total CHOL/HDL Ratio: 2.5 Ratio (ref <5.0)
VLDL: 11 mg/dL (ref <30)

## 2016-04-12 LAB — CBC
HCT: 43.9 % (ref 35.0–45.0)
HEMOGLOBIN: 14.8 g/dL (ref 11.7–15.5)
MCH: 30.9 pg (ref 27.0–33.0)
MCHC: 33.7 g/dL (ref 32.0–36.0)
MCV: 91.6 fL (ref 80.0–100.0)
MPV: 10.1 fL (ref 7.5–12.5)
Platelets: 219 10*3/uL (ref 140–400)
RBC: 4.79 MIL/uL (ref 3.80–5.10)
RDW: 13.3 % (ref 11.0–15.0)
WBC: 4.8 10*3/uL (ref 3.8–10.8)

## 2016-04-12 LAB — COMPREHENSIVE METABOLIC PANEL
ALBUMIN: 4.3 g/dL (ref 3.6–5.1)
ALT: 17 U/L (ref 6–29)
AST: 23 U/L (ref 10–35)
Alkaline Phosphatase: 44 U/L (ref 33–130)
BILIRUBIN TOTAL: 0.6 mg/dL (ref 0.2–1.2)
BUN: 11 mg/dL (ref 7–25)
CO2: 27 mmol/L (ref 20–31)
CREATININE: 0.79 mg/dL (ref 0.50–0.99)
Calcium: 9.2 mg/dL (ref 8.6–10.4)
Chloride: 103 mmol/L (ref 98–110)
Glucose, Bld: 113 mg/dL — ABNORMAL HIGH (ref 65–99)
Potassium: 4.2 mmol/L (ref 3.5–5.3)
SODIUM: 141 mmol/L (ref 135–146)
TOTAL PROTEIN: 6.8 g/dL (ref 6.1–8.1)

## 2016-04-12 LAB — TSH: TSH: 1.63 mIU/L

## 2016-04-12 LAB — POCT URINALYSIS DIPSTICK
BILIRUBIN UA: NEGATIVE
GLUCOSE UA: NEGATIVE
KETONES UA: NEGATIVE
LEUKOCYTES UA: NEGATIVE
Nitrite, UA: NEGATIVE
PH UA: 5 (ref 5.0–8.0)
Protein, UA: NEGATIVE
Urobilinogen, UA: NEGATIVE (ref ?–2.0)

## 2016-04-12 LAB — HEMOGLOBIN, FINGERSTICK: HEMOGLOBIN, FINGERSTICK: 14.5 g/dL (ref 12.0–15.0)

## 2016-04-12 MED ORDER — PROGESTERONE MICRONIZED 100 MG PO CAPS: 100.0000 mg | ORAL_CAPSULE | Freq: Every day | ORAL | 0 refills | Status: DC

## 2016-04-12 MED ORDER — ESTRADIOL 0.0375 MG/24HR TD PTTW: MEDICATED_PATCH | TRANSDERMAL | 0 refills | Status: DC

## 2016-04-12 NOTE — Patient Instructions (Signed)

## 2016-04-12 NOTE — Addendum Note (Signed)
Addended by: Lowella Fairy on: 04/12/2016 12:56 PM   Modules accepted: Orders

## 2016-04-13 LAB — HEPATITIS C ANTIBODY: HCV AB: NEGATIVE

## 2016-04-13 LAB — VITAMIN D 25 HYDROXY (VIT D DEFICIENCY, FRACTURES): VIT D 25 HYDROXY: 21 ng/mL — AB (ref 30–100)

## 2016-04-14 LAB — IPS PAP TEST WITH HPV

## 2016-04-15 ENCOUNTER — Encounter: Payer: Self-pay | Admitting: Obstetrics and Gynecology

## 2016-04-15 LAB — HIV ANTIBODY (ROUTINE TESTING W REFLEX): HIV: NONREACTIVE

## 2016-04-15 NOTE — Addendum Note (Signed)
Addended by: Yisroel Ramming, Dietrich Pates E on: 04/15/2016 06:45 PM   Modules accepted: Orders

## 2016-04-17 ENCOUNTER — Telehealth: Payer: Self-pay | Admitting: *Deleted

## 2016-04-17 ENCOUNTER — Other Ambulatory Visit: Payer: Self-pay | Admitting: *Deleted

## 2016-04-17 MED ORDER — VITAMIN D (ERGOCALCIFEROL) 1.25 MG (50000 UNIT) PO CAPS: 50000.0000 [IU] | ORAL_CAPSULE | ORAL | 0 refills | Status: DC

## 2016-04-17 NOTE — Telephone Encounter (Signed)
Thank you for having the lab add the hemoglobin A1C.

## 2016-04-17 NOTE — Telephone Encounter (Signed)
-----   Message from Nunzio Cobbs, MD sent at 04/15/2016  6:45 PM EDT ----- Please report results to patient.   Her vit D is low and I am recommending vit D 50,000 IU weekly for 3 months.  Please send to pharmacy of choice. Please schedule a lab visit for 3 months to recheck this.  I am placing a future order.   Her metabolic profile showed slightly elevated glucose.  Please have the lab add an hemoglobin A1C due to her elevated glucose.   Her lipid profile showed a minimally elevated LDL cholesterol and excellent cholesterol ratios.  I am not concerned about her cholesterol.  CBC and TSH were normal.  HIV and hep C were both negative.   Pap and HR HPV negative.   Please place pap recall - 02.

## 2016-04-17 NOTE — Telephone Encounter (Signed)
Spoke with patient, advised of results and recommendations as seen below per Dr. Quincy Simmonds. Rx for  Vit D sent to verified pharmacy on file. Patient scheduled for 3 month Vit D recheck on 07/17/16 at 11:30am. Patient verbalizes understanding and is agreeable.  Routing to provider for final review. Patient is agreeable to disposition. Will close encounter.

## 2016-04-17 NOTE — Telephone Encounter (Signed)
Left message to call Sharee Pimple at [phone].    Notes recorded by Burnice Logan, RN on 04/17/2016 at 8:18 AM EDT Spoke with Caren Griffins, lab technician, will add hemoglobin A1C to blood previously collected on 04/12/16.

## 2016-04-18 ENCOUNTER — Telehealth: Payer: Self-pay | Admitting: *Deleted

## 2016-04-18 LAB — HEMOGLOBIN A1C
Hgb A1c MFr Bld: 5.1 % (ref <5.7)
Mean Plasma Glucose: 100 mg/dL

## 2016-04-18 NOTE — Telephone Encounter (Signed)
-----   Message from Nunzio Cobbs, MD sent at 04/18/2016  9:46 AM EDT ----- Please report normal hemoglobin A1C to patient.  There is no sign of prediabetes or diabetes.

## 2016-04-18 NOTE — Telephone Encounter (Signed)
Spoke with patient, advised of results as seen below per Dr. Quincy Simmonds. Patient verbalizes understanding and is agreeable.  Routing to provider for final review. Patient is agreeable to disposition. Will close encounter.

## 2016-04-18 NOTE — Telephone Encounter (Signed)
Left message to call Marc Leichter at [phone].  

## 2016-04-18 NOTE — Telephone Encounter (Signed)
Patient returning your call.

## 2016-05-15 ENCOUNTER — Ambulatory Visit
Admission: RE | Admit: 2016-05-15 | Discharge: 2016-05-15 | Disposition: A | Discharge status: Home / Self Care | Payer: BLUE CROSS/BLUE SHIELD | Source: Ambulatory Visit | Attending: Obstetrics and Gynecology | Admitting: Obstetrics and Gynecology

## 2016-05-15 DIAGNOSIS — Z1231 Encounter for screening mammogram for malignant neoplasm of breast: Secondary | ICD-10-CM | POA: Diagnosis not present

## 2016-05-16 DIAGNOSIS — H04123 Dry eye syndrome of bilateral lacrimal glands: Secondary | ICD-10-CM | POA: Diagnosis not present

## 2016-05-16 DIAGNOSIS — H2513 Age-related nuclear cataract, bilateral: Secondary | ICD-10-CM | POA: Diagnosis not present

## 2016-06-23 NOTE — Addendum Note (Signed)
Addended by: Zoila Shutter D on: 06/23/2016 12:20 PM   Modules accepted: Orders

## 2016-07-17 ENCOUNTER — Other Ambulatory Visit (INDEPENDENT_AMBULATORY_CARE_PROVIDER_SITE_OTHER): Payer: BLUE CROSS/BLUE SHIELD

## 2016-07-17 DIAGNOSIS — E559 Vitamin D deficiency, unspecified: Secondary | ICD-10-CM

## 2016-07-18 LAB — VITAMIN D 25 HYDROXY (VIT D DEFICIENCY, FRACTURES): Vit D, 25-Hydroxy: 45 ng/mL (ref 30.0–100.0)

## 2016-07-19 ENCOUNTER — Telehealth: Payer: Self-pay

## 2016-07-19 NOTE — Telephone Encounter (Signed)
-----   Message from Nunzio Cobbs, MD sent at 07/18/2016  6:27 PM EDT ----- Please report normal vitamin D level to patient.  She can take over the counter vitamin D 800 IU daily. She does not need further prescription vitamin D at this time.  I am high lighting this note so that you know the patient needs a phone call.

## 2016-07-19 NOTE — Telephone Encounter (Signed)
Tried calling patient, no answer, per DPR okay to leave a detailed message at mobile number [phone]. Advised if any questions, give me a call back at [phone]

## 2016-07-25 DIAGNOSIS — H10413 Chronic giant papillary conjunctivitis, bilateral: Secondary | ICD-10-CM | POA: Diagnosis not present

## 2016-07-25 DIAGNOSIS — H04123 Dry eye syndrome of bilateral lacrimal glands: Secondary | ICD-10-CM | POA: Diagnosis not present

## 2016-08-25 ENCOUNTER — Telehealth: Payer: Self-pay | Admitting: Obstetrics and Gynecology

## 2016-08-25 ENCOUNTER — Other Ambulatory Visit: Payer: Self-pay | Admitting: Obstetrics and Gynecology

## 2016-08-25 NOTE — Telephone Encounter (Signed)
Electronic refill request also received on 08/25/16. Prescription to be filled through that encounter.  Closing encounter.

## 2016-08-25 NOTE — Telephone Encounter (Signed)
eScribe request from [facility] for refill on [place] Last filled - 04/12/16, #90 X 0 RF Last AEX - 04/12/16  Next AEX - 04/18/17 Last MMG - 05/15/16, Bi-Rads 1:  Negative, repeat in one year  Please advise refills. Thank you.

## 2016-08-25 NOTE — Telephone Encounter (Signed)
Patient called to request refill of progesterone be sent to [facility]. She needs this to be refilled today as she is going out of town.

## 2016-10-04 ENCOUNTER — Other Ambulatory Visit: Payer: Self-pay | Admitting: Obstetrics and Gynecology

## 2016-10-04 NOTE — Telephone Encounter (Signed)
Spoke with patient and she states that she tried using half of a patch but she did not do well with it. She would like to continue the current dose

## 2016-10-04 NOTE — Telephone Encounter (Signed)
Medication refill request: Estradiol  Last AEX:  04-12-16  Next AEX: 04-18-17  Last MMG (if hormonal medication request): 05-15-16 WNL  Refill authorized: please advise

## 2016-10-04 NOTE — Telephone Encounter (Signed)
Left message to call regarding refill request

## 2016-10-04 NOTE — Telephone Encounter (Signed)
Pt returning your call

## 2016-10-04 NOTE — Telephone Encounter (Signed)
Please call and check on the patient. Dr Quincy Simmonds discusses weaning her off of HRT. Does she want to go on the lower dose patch?

## 2016-11-23 ENCOUNTER — Other Ambulatory Visit: Payer: Self-pay | Admitting: Obstetrics and Gynecology

## 2016-11-23 NOTE — Telephone Encounter (Signed)
Medication refill request: Progesterone Last AEX:  04/12/16 BS Next AEX: 04/18/17  Last MMG (if hormonal medication request): 05/15/16 BIRADS 1 negative/density c Refill authorized: 08/25/16 #90 w/0 refills; today please advise

## 2017-02-14 DIAGNOSIS — Z1283 Encounter for screening for malignant neoplasm of skin: Secondary | ICD-10-CM | POA: Diagnosis not present

## 2017-02-14 DIAGNOSIS — L821 Other seborrheic keratosis: Secondary | ICD-10-CM | POA: Diagnosis not present

## 2017-02-21 DIAGNOSIS — K219 Gastro-esophageal reflux disease without esophagitis: Secondary | ICD-10-CM | POA: Diagnosis not present

## 2017-02-21 DIAGNOSIS — X32XXXD Exposure to sunlight, subsequent encounter: Secondary | ICD-10-CM | POA: Diagnosis not present

## 2017-02-21 DIAGNOSIS — L570 Actinic keratosis: Secondary | ICD-10-CM | POA: Diagnosis not present

## 2017-02-21 DIAGNOSIS — K2270 Barrett's esophagus without dysplasia: Secondary | ICD-10-CM | POA: Diagnosis not present

## 2017-04-03 DIAGNOSIS — J1100 Influenza due to unidentified influenza virus with unspecified type of pneumonia: Secondary | ICD-10-CM | POA: Diagnosis not present

## 2017-04-03 DIAGNOSIS — K2270 Barrett's esophagus without dysplasia: Secondary | ICD-10-CM | POA: Diagnosis not present

## 2017-04-03 DIAGNOSIS — J029 Acute pharyngitis, unspecified: Secondary | ICD-10-CM | POA: Diagnosis not present

## 2017-04-03 DIAGNOSIS — J209 Acute bronchitis, unspecified: Secondary | ICD-10-CM | POA: Diagnosis not present

## 2017-04-18 ENCOUNTER — Ambulatory Visit (INDEPENDENT_AMBULATORY_CARE_PROVIDER_SITE_OTHER): Payer: BLUE CROSS/BLUE SHIELD | Admitting: Obstetrics and Gynecology

## 2017-04-18 ENCOUNTER — Encounter: Payer: Self-pay | Admitting: Obstetrics and Gynecology

## 2017-04-18 ENCOUNTER — Other Ambulatory Visit: Payer: Self-pay

## 2017-04-18 VITALS — BP 110/60 | HR 70 | Resp 16 | Ht 64.5 in | Wt 148.0 lb

## 2017-04-18 DIAGNOSIS — R102 Pelvic and perineal pain unspecified side: Secondary | ICD-10-CM

## 2017-04-18 DIAGNOSIS — Z01419 Encounter for gynecological examination (general) (routine) without abnormal findings: Secondary | ICD-10-CM

## 2017-04-18 LAB — POCT URINALYSIS DIPSTICK
BILIRUBIN UA: NEGATIVE
Glucose, UA: NEGATIVE
Ketones, UA: NEGATIVE
Nitrite, UA: NEGATIVE
PH UA: 5 (ref 5.0–8.0)
Protein, UA: NEGATIVE
UROBILINOGEN UA: 0.2 U/dL

## 2017-04-18 MED ORDER — SULFAMETHOXAZOLE-TRIMETHOPRIM 800-160 MG PO TABS: 1.0000 | ORAL_TABLET | Freq: Two times a day (BID) | ORAL | 0 refills | Status: DC

## 2017-04-18 MED ORDER — PROGESTERONE MICRONIZED 100 MG PO CAPS: ORAL_CAPSULE | ORAL | 3 refills | Status: DC

## 2017-04-18 MED ORDER — ESTRADIOL 0.0375 MG/24HR TD PTTW: MEDICATED_PATCH | TRANSDERMAL | 3 refills | Status: DC

## 2017-04-18 NOTE — Progress Notes (Signed)
62 y.o. G2P2 Married Caucasian female here for annual exam.  Patient complains of having pelvic pressure. Urine showed +1 RBC and trace of WBC  Would like to loose 20 pounds.   On HRT.  Afraid to do this.  Tried to cut her patch in half and had hot flashes.  Stretching the patches out to once weekly instead.   Wants routine labs including vit D.  Feeling fatigue.   PCP:  Dr. Foye Deer   Patient's last menstrual period was 01/24/2003 (approximate).           Sexually active: Yes.    The current method of family planning is tubal ligation.    Exercising: Yes.    gym and running Smoker:  no  Health Maintenance: Pap:  04/12/16 Pap and HR HPV negative History of abnormal Pap:  Yes,12-24-10 colpo bx - squamous epithelium w/HPV effect, 01-27-09 colpo bx - LGSIL, ECC with fragment of atypia. 01-26-10 LSIL pap and 12/2008 pap LSIL. 1999 remote hx of cryotherapy to cervix.  MMG:  05/15/16 BIRADS 1 negative/density c Colonoscopy:  02/2015 polyp with [facility];next due 02/2018 BMD:   n/a  Result  n/a TDaP:  12/24/2008 Gardasil:   n/a HIV and Hep C: 04/12/16 Negative Screening Labs: PCP   reports that she has never smoked. She has never used smokeless tobacco. She reports that she drinks about 1.0 oz of alcohol per week. She reports that she does not use drugs.  Past Medical History:  Diagnosis Date  . Abnormal Pap smear of cervix 1999   Cryo  . Amenorrhea   . Cancer [facility]) 2009   Sq cell cancer Left face  . GERD (gastroesophageal reflux disease)   . H/O gastric ulcer   . Menorrhagia 3/05  . MVP (mitral valve prolapse)   . Vitamin D deficiency 03/2016    Past Surgical History:  Procedure Laterality Date  . ABLATION  06/15/03   HTA    . Birth Mark Removed     foot  . COLPOSCOPY  01/2009   CIN 1  . ENDOMETRIAL BIOPSY  03/2003   benign  . GYNECOLOGIC CRYOSURGERY  1999  . TUBAL LIGATION     BTL    Current Outpatient Medications  Medication Sig Dispense Refill  .  Cholecalciferol (VITAMIN D3) 1000 units CAPS Take 1 capsule by mouth daily.    . cycloSPORINE (RESTASIS) 0.05 % ophthalmic emulsion 1 drop 2 (two) times daily.    Marland Kitchen DEXILANT 60 MG capsule Take 1 tablet by mouth daily. Patient takes qod  11  . estradiol (VIVELLE-DOT) 0.0375 MG/24HR APPLY 1 PATCH EXTERNALLY TO THE SKIN 2 TIMES A WEEK 24 patch 2  . progesterone (PROMETRIUM) 100 MG capsule TAKE 1 CAPSULE(100 MG) BY MOUTH DAILY 90 capsule 1  . tetracycline (ACHROMYCIN,SUMYCIN) 500 MG capsule Take 500 mg by mouth as needed. Reported on 03/24/2015--prn for Rosacae     No current facility-administered medications for this visit.     Family History  Problem Relation Age of Onset  . Hypertension Mother   . Anemia Mother   . Heart disease Mother   . Heart disease Father   . Cancer Brother        Lia Hopping  4 Sarcoma--Dec age 82  . Breast cancer Maternal Aunt     ROS:  Pertinent items are noted in HPI.  Otherwise, a comprehensive ROS was negative.  Exam:   BP 110/60 (BP Location: Right Arm, Patient Position: Sitting, Cuff Size: Normal)  Pulse 70   Resp 16   Ht 5' 4.5" (1.638 m)   Wt 148 lb (67.1 kg)   LMP 01/24/2003 (Approximate)   BMI 25.01 kg/m     General appearance: alert, cooperative and appears stated age Head: Normocephalic, without obvious abnormality, atraumatic Neck: no adenopathy, supple, symmetrical, trachea midline and thyroid normal to inspection and palpation Lungs: clear to auscultation bilaterally Breasts: normal appearance, no masses or tenderness, No nipple retraction or dimpling, No nipple discharge or bleeding, No axillary or supraclavicular adenopathy Heart: regular rate and rhythm Abdomen: soft, non-tender; no masses, no organomegaly Extremities: extremities normal, atraumatic, no cyanosis or edema Skin: Skin color, texture, turgor normal. No rashes or lesions Lymph nodes: Cervical, supraclavicular, and axillary nodes normal. No abnormal inguinal nodes  palpated Neurologic: Grossly normal  Pelvic: External genitalia:  no lesions              Urethra:  normal appearing urethra with no masses, tenderness or lesions              Bartholins and Skenes: normal                 Vagina: normal appearing vagina with normal color and discharge, no lesions              Cervix: no lesions              Pap taken: No. Bimanual Exam:  Uterus:  normal size, contour, position, consistency, mobility, non-tender              Adnexa: no mass, fullness, tenderness              Rectal exam: Yes.  .  Confirms.              Anus:  normal sphincter tone, no lesions  Chaperone was present for exam.  Assessment:   Well woman visit with normal exam. Hx LGSIL. HRT patient.  Status post endometrial ablation.  Pelvic pressure. UTI.  FH CAD.   Plan: Mammogram screening discussed. Recommended self breast awareness. Pap and HR HPV as above. Guidelines for Calcium, Vitamin D, regular exercise program including cardiovascular and weight bearing exercise. Routine labs.  Bactrim DS po tid x 3 days.  Urine micro and cx.  Refills of HRT.  Discussed risks of PE, DVT, MI, stroke, and breast cancer.  We discussed the possibility of discontinuing her HRT after her blood work is back if she has unfavorable cholesterol. I discussed [facility] Weight Management program.  She will look into this.  Follow up annually and prn.   After visit summary provided.

## 2017-04-18 NOTE — Patient Instructions (Signed)

## 2017-04-19 LAB — COMPREHENSIVE METABOLIC PANEL
A/G RATIO: 1.9 (ref 1.2–2.2)
ALBUMIN: 4.3 g/dL (ref 3.6–4.8)
ALT: 19 IU/L (ref 0–32)
AST: 18 IU/L (ref 0–40)
Alkaline Phosphatase: 48 IU/L (ref 39–117)
BILIRUBIN TOTAL: 0.3 mg/dL (ref 0.0–1.2)
BUN / CREAT RATIO: 21 (ref 12–28)
BUN: 16 mg/dL (ref 8–27)
CALCIUM: 9.4 mg/dL (ref 8.7–10.3)
CHLORIDE: 103 mmol/L (ref 96–106)
CO2: 27 mmol/L (ref 20–29)
Creatinine, Ser: 0.76 mg/dL (ref 0.57–1.00)
GFR, EST AFRICAN AMERICAN: 98 mL/min/{1.73_m2} (ref >59)
GFR, EST NON AFRICAN AMERICAN: 85 mL/min/{1.73_m2} (ref >59)
Globulin, Total: 2.3 g/dL (ref 1.5–4.5)
Glucose: 86 mg/dL (ref 65–99)
Potassium: 4.6 mmol/L (ref 3.5–5.2)
Sodium: 141 mmol/L (ref 134–144)
TOTAL PROTEIN: 6.6 g/dL (ref 6.0–8.5)

## 2017-04-19 LAB — CBC
HEMATOCRIT: 41 % (ref 34.0–46.6)
HEMOGLOBIN: 13.9 g/dL (ref 11.1–15.9)
MCH: 30.3 pg (ref 26.6–33.0)
MCHC: 33.9 g/dL (ref 31.5–35.7)
MCV: 89 fL (ref 79–97)
Platelets: 201 10*3/uL (ref 150–379)
RBC: 4.59 x10E6/uL (ref 3.77–5.28)
RDW: 13.2 % (ref 12.3–15.4)
WBC: 5.6 10*3/uL (ref 3.4–10.8)

## 2017-04-19 LAB — LIPID PANEL
CHOL/HDL RATIO: 3 ratio (ref 0.0–4.4)
Cholesterol, Total: 201 mg/dL — ABNORMAL HIGH (ref 100–199)
HDL: 67 mg/dL (ref >39)
LDL Calculated: 114 mg/dL — ABNORMAL HIGH (ref 0–99)
Triglycerides: 101 mg/dL (ref 0–149)
VLDL CHOLESTEROL CAL: 20 mg/dL (ref 5–40)

## 2017-04-19 LAB — URINE CULTURE

## 2017-04-19 LAB — VITAMIN D 25 HYDROXY (VIT D DEFICIENCY, FRACTURES): Vit D, 25-Hydroxy: 39.3 ng/mL (ref 30.0–100.0)

## 2017-04-19 LAB — URINALYSIS, MICROSCOPIC ONLY
Casts: NONE SEEN /lpf
Epithelial Cells (non renal): >10 /hpf — AB (ref 0–10)

## 2017-04-19 LAB — TSH: TSH: 1.67 u[IU]/mL (ref 0.450–4.500)

## 2017-04-23 ENCOUNTER — Telehealth: Payer: Self-pay | Admitting: *Deleted

## 2017-04-23 NOTE — Telephone Encounter (Signed)
Spoke with patient, advised as seen below per Dr. Quincy Simmonds. Patient states symptoms have resolved. No additional f/u needed at this time. Patient aware to return call with any additional questions/concerns. Will close encounter.

## 2017-04-23 NOTE — Telephone Encounter (Signed)
Notes recorded by Burnice Logan, RN on 04/23/2017 at 1:52 PM EDT Left message to call Sharee Pimple at [phone].

## 2017-04-23 NOTE — Telephone Encounter (Signed)
-----   Message from Nunzio Cobbs, MD sent at 04/22/2017 10:03 AM EDT ----- Please report final results to patient.   The following test results were normal: blood chemistries complete blood counts thyroid vitamin D  Her LDL cholesterol was elevated, but the ratios of cholesterol overall are in an acceptable range.  She may lower your cholesterol through vigorous exercise most days of the week and a diet low in saturated fats and cholesterol.  Her final urine culture is negative for infection.  By now, she has likely finished the Bactrim DS.  If she still has pelvic pressure symptoms, I would like her to have an office recheck and pelvic ultrasound.

## 2017-05-09 DIAGNOSIS — H2513 Age-related nuclear cataract, bilateral: Secondary | ICD-10-CM | POA: Diagnosis not present

## 2017-05-09 DIAGNOSIS — H04123 Dry eye syndrome of bilateral lacrimal glands: Secondary | ICD-10-CM | POA: Diagnosis not present

## 2017-06-15 ENCOUNTER — Other Ambulatory Visit: Payer: Self-pay | Admitting: Obstetrics and Gynecology

## 2017-06-15 DIAGNOSIS — Z1231 Encounter for screening mammogram for malignant neoplasm of breast: Secondary | ICD-10-CM

## 2017-07-12 ENCOUNTER — Ambulatory Visit
Admission: RE | Admit: 2017-07-12 | Discharge: 2017-07-12 | Disposition: A | Discharge status: Home / Self Care | Payer: BLUE CROSS/BLUE SHIELD | Source: Ambulatory Visit | Attending: Obstetrics and Gynecology | Admitting: Obstetrics and Gynecology

## 2017-07-12 DIAGNOSIS — Z1231 Encounter for screening mammogram for malignant neoplasm of breast: Secondary | ICD-10-CM | POA: Diagnosis not present

## 2017-09-13 DIAGNOSIS — M25561 Pain in right knee: Secondary | ICD-10-CM | POA: Diagnosis not present

## 2017-09-13 DIAGNOSIS — M2241 Chondromalacia patellae, right knee: Secondary | ICD-10-CM | POA: Diagnosis not present

## 2017-10-28 ENCOUNTER — Other Ambulatory Visit: Payer: Self-pay | Admitting: Obstetrics and Gynecology

## 2017-10-29 NOTE — Telephone Encounter (Signed)
Will route to Dr Quincy Simmonds

## 2017-10-29 NOTE — Telephone Encounter (Signed)
Medication refill request: Estradiol Last AEX:  04/18/2017 Next AEX: 04/22/2018 Last MMG (if hormonal medication request): 07/12/2017 BI-RADS CATEGORY  1: Negative Refill authorized: #24, 3 refills

## 2017-12-05 DIAGNOSIS — Z23 Encounter for immunization: Secondary | ICD-10-CM | POA: Diagnosis not present

## 2018-01-21 DIAGNOSIS — K529 Noninfective gastroenteritis and colitis, unspecified: Secondary | ICD-10-CM | POA: Diagnosis not present

## 2018-01-21 DIAGNOSIS — R197 Diarrhea, unspecified: Secondary | ICD-10-CM | POA: Diagnosis not present

## 2018-01-21 DIAGNOSIS — K219 Gastro-esophageal reflux disease without esophagitis: Secondary | ICD-10-CM | POA: Diagnosis not present

## 2018-02-12 DIAGNOSIS — K219 Gastro-esophageal reflux disease without esophagitis: Secondary | ICD-10-CM | POA: Diagnosis not present

## 2018-03-18 DIAGNOSIS — Z1283 Encounter for screening for malignant neoplasm of skin: Secondary | ICD-10-CM | POA: Diagnosis not present

## 2018-03-18 DIAGNOSIS — I781 Nevus, non-neoplastic: Secondary | ICD-10-CM | POA: Diagnosis not present

## 2018-03-18 DIAGNOSIS — L728 Other follicular cysts of the skin and subcutaneous tissue: Secondary | ICD-10-CM | POA: Diagnosis not present

## 2018-03-18 DIAGNOSIS — L820 Inflamed seborrheic keratosis: Secondary | ICD-10-CM | POA: Diagnosis not present

## 2018-03-18 DIAGNOSIS — L821 Other seborrheic keratosis: Secondary | ICD-10-CM | POA: Diagnosis not present

## 2018-04-22 ENCOUNTER — Ambulatory Visit: Payer: BLUE CROSS/BLUE SHIELD | Admitting: Obstetrics and Gynecology

## 2018-05-30 ENCOUNTER — Other Ambulatory Visit: Payer: Self-pay | Admitting: Obstetrics and Gynecology

## 2018-05-30 NOTE — Telephone Encounter (Signed)
Medication refill request: Progesterone  Last AEX:  04/18/17 Dr. Quincy Simmonds  Next AEX: none Last MMG (if hormonal medication request): 07/12/17 BIRADS1:Neg  Refill authorized: 04/18/17 #90caps/3R. Today please advise.

## 2018-06-19 ENCOUNTER — Other Ambulatory Visit: Payer: Self-pay | Admitting: Obstetrics and Gynecology

## 2018-06-19 DIAGNOSIS — Z1231 Encounter for screening mammogram for malignant neoplasm of breast: Secondary | ICD-10-CM

## 2018-06-20 NOTE — Progress Notes (Signed)
63 y.o. G2P2 Married Caucasian female here for annual exam.    On HRT. Using one patch per week instead of twice a week.  She is happy doing this.  She tried to stop 2 years ago, and she was miserable.  She tried to cut the patch in half and her hot flashes were worse.  Unhappy with her weight.  Walking 3 miles per day.  Husband is 37 and a cancer survivor.   Now having visits with family for the first time in months.  Mother is [place].  She is 91.   PCP: Donald Prose, MD     Patient's last menstrual period was 01/24/2003 (approximate).           Sexually active: Yes.    The current method of family planning is tubal ligation.    Exercising: Yes.    walking Smoker:  no  Health Maintenance: Pap:  04/12/16 Pap and HR HPV negative, 03/10/13- neg, neg HR HPV History of abnormal Pap:   Yes,12-24-10 colpo bx - squamous epithelium w/HPV effect, 01-27-09 colpo bx - LGSIL, ECC with fragment of atypia. 01-26-10 LSIL pap and 12/2008 pap LSIL. 1999 remote hx of cryotherapy to cervix MMG:  07/12/17 BIRADS 1 negative/density b Colonoscopy:  02/2015 polyp with [facility]; patient will schedule BMD:   n/a  Result  n/a TDaP:  2010 Gardasil:   n/a HIV and Hep C: 04/12/16 Negative Screening Labs: PCP   reports that she has never smoked. She has never used smokeless tobacco. She reports current alcohol use of about 2.0 standard drinks of alcohol per week. She reports that she does not use drugs.  Past Medical History:  Diagnosis Date  . Abnormal Pap smear of cervix 1999   Cryo  . Amenorrhea   . Cancer [facility]) 2009   Sq cell cancer Left face  . GERD (gastroesophageal reflux disease)   . H/O gastric ulcer   . Menorrhagia 3/05  . MVP (mitral valve prolapse)   . Vitamin D deficiency 03/2016    Past Surgical History:  Procedure Laterality Date  . ABLATION  06/15/03   HTA    . Birth Mark Removed     foot  . COLPOSCOPY  01/2009   CIN 1  . ENDOMETRIAL BIOPSY  03/2003   benign  .  GYNECOLOGIC CRYOSURGERY  1999  . TUBAL LIGATION     BTL    Current Outpatient Medications  Medication Sig Dispense Refill  . Cholecalciferol (VITAMIN D3) 1000 units CAPS Take 1 capsule by mouth daily.    . cycloSPORINE (RESTASIS) 0.05 % ophthalmic emulsion 1 drop 2 (two) times daily.    Marland Kitchen DEXILANT 60 MG capsule Take 1 tablet by mouth daily. Patient takes qod  11  . estradiol (VIVELLE-DOT) 0.0375 MG/24HR APPLY 1 PATCH EXTERNALLY TO THE SKIN 2 TIMES A WEEK 24 patch 1  . progesterone (PROMETRIUM) 100 MG capsule TAKE 1 CAPSULE(100 MG) BY MOUTH DAILY 90 capsule 0  . tetracycline (ACHROMYCIN,SUMYCIN) 500 MG capsule Take 500 mg by mouth as needed. Reported on 03/24/2015--prn for Rosacae     No current facility-administered medications for this visit.     Family History  Problem Relation Age of Onset  . Hypertension Mother   . Anemia Mother   . Heart disease Mother   . Heart disease Father   . Cancer Brother        Lia Hopping  4 Sarcoma--Dec age 8  . Breast cancer Maternal Aunt     Review  of Systems  Constitutional: Negative.   HENT: Negative.   Eyes: Negative.   Respiratory: Negative.   Cardiovascular: Negative.   Gastrointestinal: Negative.   Endocrine: Negative.   Genitourinary: Negative.   Musculoskeletal: Negative.   Skin: Negative.   Allergic/Immunologic: Negative.   Neurological: Negative.   Hematological: Negative.   Psychiatric/Behavioral: Negative.     Exam:   BP 108/60 (BP Location: Right Arm, Patient Position: Sitting, Cuff Size: Normal)   Pulse 80   Temp 98 F (36.7 C) (Temporal)   Resp 14   Ht 5\' 5"  (1.651 m)   Wt 146 lb (66.2 kg)   LMP 01/24/2003 (Approximate)   BMI 24.30 kg/m     General appearance: alert, cooperative and appears stated age Head: Normocephalic, without obvious abnormality, atraumatic Neck: no adenopathy, supple, symmetrical, trachea midline and thyroid normal to inspection and palpation Lungs: clear to auscultation bilaterally Breasts:  normal appearance, no masses or tenderness, No nipple retraction or dimpling, No nipple discharge or bleeding, No axillary or supraclavicular adenopathy Heart: regular rate and rhythm Abdomen: soft, non-tender; no masses, no organomegaly Extremities: extremities normal, atraumatic, no cyanosis or edema Skin: Skin color, texture, turgor normal. No rashes or lesions Lymph nodes: Cervical, supraclavicular, and axillary nodes normal. No abnormal inguinal nodes palpated Neurologic: Grossly normal  Pelvic: External genitalia:  no lesions              Urethra:  normal appearing urethra with no masses, tenderness or lesions              Bartholins and Skenes: normal                 Vagina: normal appearing vagina with normal color and discharge, no lesions              Cervix: no lesions              Pap taken: No. Bimanual Exam:  Uterus:  normal size, contour, position, consistency, mobility, non-tender              Adnexa: no mass, fullness, tenderness              Rectal exam: Yes.  .  Confirms.              Anus:  normal sphincter tone, no lesions  Chaperone was present for exam.  Assessment:   Well woman visit with normal exam. 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  HRT patient.  Status post endometrial ablation.  FH CAD.   Plan: Mammogram screening. Recommended self breast awareness. Pap and HR HPV as above. Guidelines for Calcium, Vitamin D, regular exercise program including cardiovascular and weight bearing exercise. Discused WHI and use of HRT which can increase risk of PE, DVT, MI, stroke and breast cancer.  She will continue her HRT.  Rx for Minivelle and Prometrium for one year.  Routine labs.  TDap.  She will follow up for her colonoscopy/endoscopy.  Follow up annually and prn.   After visit summary provided.

## 2018-06-21 ENCOUNTER — Other Ambulatory Visit: Payer: Self-pay

## 2018-06-21 ENCOUNTER — Ambulatory Visit (INDEPENDENT_AMBULATORY_CARE_PROVIDER_SITE_OTHER): Payer: BLUE CROSS/BLUE SHIELD | Admitting: Obstetrics and Gynecology

## 2018-06-21 ENCOUNTER — Encounter: Payer: Self-pay | Admitting: Obstetrics and Gynecology

## 2018-06-21 VITALS — BP 108/60 | HR 80 | Temp 98.0°F | Resp 14 | Ht 65.0 in | Wt 146.0 lb

## 2018-06-21 DIAGNOSIS — Z01419 Encounter for gynecological examination (general) (routine) without abnormal findings: Secondary | ICD-10-CM | POA: Diagnosis not present

## 2018-06-21 DIAGNOSIS — Z23 Encounter for immunization: Secondary | ICD-10-CM

## 2018-06-21 MED ORDER — PROGESTERONE MICRONIZED 100 MG PO CAPS: ORAL_CAPSULE | ORAL | 3 refills | Status: DC

## 2018-06-21 MED ORDER — ESTRADIOL 0.0375 MG/24HR TD PTTW: MEDICATED_PATCH | TRANSDERMAL | 3 refills | Status: DC

## 2018-06-21 NOTE — Patient Instructions (Signed)

## 2018-06-22 LAB — COMPREHENSIVE METABOLIC PANEL
ALT: 12 IU/L (ref 0–32)
AST: 19 IU/L (ref 0–40)
Albumin/Globulin Ratio: 1.8 (ref 1.2–2.2)
Albumin: 4.4 g/dL (ref 3.8–4.8)
Alkaline Phosphatase: 52 IU/L (ref 39–117)
BUN/Creatinine Ratio: 15 (ref 12–28)
BUN: 13 mg/dL (ref 8–27)
Bilirubin Total: 0.5 mg/dL (ref 0.0–1.2)
CO2: 27 mmol/L (ref 20–29)
Calcium: 9.4 mg/dL (ref 8.7–10.3)
Chloride: 100 mmol/L (ref 96–106)
Creatinine, Ser: 0.88 mg/dL (ref 0.57–1.00)
GFR calc Af Amer: 81 mL/min/{1.73_m2} (ref >59)
GFR calc non Af Amer: 71 mL/min/{1.73_m2} (ref >59)
Globulin, Total: 2.4 g/dL (ref 1.5–4.5)
Glucose: 94 mg/dL (ref 65–99)
Potassium: 5 mmol/L (ref 3.5–5.2)
Sodium: 140 mmol/L (ref 134–144)
Total Protein: 6.8 g/dL (ref 6.0–8.5)

## 2018-06-22 LAB — LIPID PANEL
Chol/HDL Ratio: 2.9 ratio (ref 0.0–4.4)
Cholesterol, Total: 217 mg/dL — ABNORMAL HIGH (ref 100–199)
HDL: 76 mg/dL (ref >39)
LDL Calculated: 126 mg/dL — ABNORMAL HIGH (ref 0–99)
Triglycerides: 75 mg/dL (ref 0–149)
VLDL Cholesterol Cal: 15 mg/dL (ref 5–40)

## 2018-06-22 LAB — CBC
Hematocrit: 46.5 % (ref 34.0–46.6)
Hemoglobin: 15.6 g/dL (ref 11.1–15.9)
MCH: 30.9 pg (ref 26.6–33.0)
MCHC: 33.5 g/dL (ref 31.5–35.7)
MCV: 92 fL (ref 79–97)
Platelets: 220 10*3/uL (ref 150–450)
RBC: 5.05 x10E6/uL (ref 3.77–5.28)
RDW: 12.9 % (ref 11.7–15.4)
WBC: 5 10*3/uL (ref 3.4–10.8)

## 2018-06-22 LAB — TSH: TSH: 2.16 u[IU]/mL (ref 0.450–4.500)

## 2018-06-22 LAB — VITAMIN D 25 HYDROXY (VIT D DEFICIENCY, FRACTURES): Vit D, 25-Hydroxy: 70.9 ng/mL (ref 30.0–100.0)

## 2018-06-27 ENCOUNTER — Telehealth: Payer: Self-pay | Admitting: Obstetrics and Gynecology

## 2018-06-27 NOTE — Telephone Encounter (Signed)
Reviewed recent labs with patient and explained cholesterol values with her. She thanked me for my time.

## 2018-06-27 NOTE — Telephone Encounter (Signed)
Patient is asking to talk with a nurse about her recent lab results.

## 2018-07-05 DIAGNOSIS — H04123 Dry eye syndrome of bilateral lacrimal glands: Secondary | ICD-10-CM | POA: Diagnosis not present

## 2018-07-05 DIAGNOSIS — H2513 Age-related nuclear cataract, bilateral: Secondary | ICD-10-CM | POA: Diagnosis not present

## 2018-07-05 DIAGNOSIS — H43811 Vitreous degeneration, right eye: Secondary | ICD-10-CM | POA: Diagnosis not present

## 2018-08-07 ENCOUNTER — Ambulatory Visit: Payer: BLUE CROSS/BLUE SHIELD

## 2018-09-18 ENCOUNTER — Ambulatory Visit
Admission: RE | Admit: 2018-09-18 | Discharge: 2018-09-18 | Disposition: A | Discharge status: Home / Self Care | Payer: BC Managed Care – PPO | Source: Ambulatory Visit | Attending: Obstetrics and Gynecology | Admitting: Obstetrics and Gynecology

## 2018-09-18 ENCOUNTER — Other Ambulatory Visit: Payer: Self-pay

## 2018-09-18 DIAGNOSIS — Z1231 Encounter for screening mammogram for malignant neoplasm of breast: Secondary | ICD-10-CM

## 2019-04-05 ENCOUNTER — Ambulatory Visit: Payer: Self-pay

## 2019-05-05 ENCOUNTER — Telehealth: Payer: Self-pay | Admitting: Obstetrics and Gynecology

## 2019-05-05 NOTE — Telephone Encounter (Signed)
Left message on voicemail to call and reschedule cancelled appointment. °

## 2019-06-27 ENCOUNTER — Ambulatory Visit: Payer: BLUE CROSS/BLUE SHIELD | Admitting: Obstetrics and Gynecology

## 2019-07-03 NOTE — Progress Notes (Signed)
64 y.o. G2P2 Married Caucasian female here for annual exam.   She likes her HRT and wants to continue.   Patient would like labs today.   Patient has been vaccinated against Covid.  Mother is at [employer].   PCP:  Donald Prose, MD   Patient's last menstrual period was 01/24/2003 (approximate).           Sexually active: Yes.    The current method of family planning is tubal ligation.    Exercising: Yes.    walks/run 3x/week Smoker:  no  Health Maintenance: Pap: 04-12-16 Neg:Neg HR HPV, 03-10-13 Neg:Neg HR HPV History of abnormal Pap:  Yes, 12-24-10 colpo bx - squamous epithelium w/HPV effect, 01-27-09 colpo bx - LGSIL, ECC with fragment of atypia. 01-26-10 LSIL pap and 12/2008 pap LSIL. 1999 remote hx of cryotherapy to cervix MMG: 09-18-18 Neg/density C/Birads1 Colonoscopy: 02/2015 polyp;next due 2022 BMD: n/a Result n/a TDaP:06-21-18 Gardasil:   no HIV:04-12-16 NR Hep C:04-12-16 Neg Screening Labs:  Today.   reports that she has never smoked. She has never used smokeless tobacco. She reports current alcohol use of about 2.0 standard drinks of alcohol per week. She reports that she does not use drugs.  Past Medical History:  Diagnosis Date  . Abnormal Pap smear of cervix 1999   Cryo  . Amenorrhea   . Cancer [facility]) 2009   Sq cell cancer Left face  . GERD (gastroesophageal reflux disease)   . H/O gastric ulcer   . Menorrhagia 3/05  . MVP (mitral valve prolapse)   . Vitamin D deficiency 03/2016    Past Surgical History:  Procedure Laterality Date  . ABLATION  06/15/03   HTA    . Birth Mark Removed     foot  . COLPOSCOPY  01/2009   CIN 1  . ENDOMETRIAL BIOPSY  03/2003   benign  . GYNECOLOGIC CRYOSURGERY  1999  . TUBAL LIGATION     BTL    Current Outpatient Medications  Medication Sig Dispense Refill  . Cholecalciferol (VITAMIN D3) 1000 units CAPS Take 1 capsule by mouth daily.    . cycloSPORINE (RESTASIS) 0.05 % ophthalmic emulsion 1 drop 2 (two) times daily.    Marland Kitchen  DEXILANT 60 MG capsule Take 1 tablet by mouth daily. Patient takes qod  11  . estradiol (VIVELLE-DOT) 0.0375 MG/24HR APPLY 1 PATCH EXTERNALLY TO THE SKIN 2 TIMES A WEEK 24 patch 3  . progesterone (PROMETRIUM) 100 MG capsule Take one capsule (100 mg) by mouth daily. 90 capsule 3  . tetracycline (ACHROMYCIN,SUMYCIN) 500 MG capsule Take 500 mg by mouth as needed. Reported on 03/24/2015--prn for Rosacae     No current facility-administered medications for this visit.    Family History  Problem Relation Age of Onset  . Hypertension Mother   . Anemia Mother   . Heart disease Mother   . Heart disease Father   . Cancer Brother        Misty Odom  4 Sarcoma--Dec age 75  . Breast cancer Maternal Aunt     Review of Systems  All other systems reviewed and are negative.   Exam:   BP 120/70   Pulse 76   Temp (!) 97.3 F (36.3 C) (Temporal)   Resp 14   Ht 5' 4.25" (1.632 m)   Wt 146 lb 3.2 oz (66.3 kg)   LMP 01/24/2003 (Approximate)   BMI 24.90 kg/m     General appearance: alert, cooperative and appears stated age Head: normocephalic, without obvious  abnormality, atraumatic Neck: no adenopathy, supple, symmetrical, trachea midline and thyroid normal to inspection and palpation Lungs: clear to auscultation bilaterally Breasts: normal appearance, no masses or tenderness, No nipple retraction or dimpling, No nipple discharge or bleeding, No axillary adenopathy Heart: regular rate and rhythm Abdomen: soft, non-tender; no masses, no organomegaly Extremities: extremities normal, atraumatic, no cyanosis or edema Skin: skin color, texture, turgor normal. No rashes or lesions Lymph nodes: cervical, supraclavicular, and axillary nodes normal. Neurologic: grossly normal  Pelvic: External genitalia:  no lesions              No abnormal inguinal nodes palpated.              Urethra:  normal appearing urethra with no masses, tenderness or lesions              Bartholins and Skenes: normal                  Vagina: normal appearing vagina with normal color and discharge, no lesions              Cervix: no lesions              Pap taken: Yes.   Bimanual Exam:  Uterus:  normal size, contour, position, consistency, mobility, non-tender              Adnexa: no mass, fullness, tenderness              Rectal exam: Yes.  .  Confirms.              Anus:  normal sphincter tone, no lesions  Chaperone was present for exam.  Assessment:   Well woman visit with normal exam. 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  HRT patient.  Status post endometrial ablation. FH CAD.  Plan: Mammogram screening discussed. Self breast awareness reviewed. Pap and HR HPV as above. Guidelines for Calcium, Vitamin D, regular exercise program including cardiovascular and weight bearing exercise. Refill of HRT for one year.   Discused WHI and use of HRT which can increase risk of PE, DVT, MI, stroke and breast cancer.  Routine labs today.  Follow up annually and prn.   After visit summary provided.

## 2019-07-07 ENCOUNTER — Other Ambulatory Visit (HOSPITAL_COMMUNITY)
Admission: RE | Admit: 2019-07-07 | Discharge: 2019-07-07 | Disposition: A | Discharge status: Home / Self Care | Payer: 59 | Source: Ambulatory Visit | Attending: Obstetrics and Gynecology | Admitting: Obstetrics and Gynecology

## 2019-07-07 ENCOUNTER — Encounter: Payer: Self-pay | Admitting: Obstetrics and Gynecology

## 2019-07-07 ENCOUNTER — Ambulatory Visit (INDEPENDENT_AMBULATORY_CARE_PROVIDER_SITE_OTHER): Payer: 59 | Admitting: Obstetrics and Gynecology

## 2019-07-07 ENCOUNTER — Other Ambulatory Visit: Payer: Self-pay

## 2019-07-07 VITALS — BP 120/70 | HR 76 | Temp 97.3°F | Resp 14 | Ht 64.25 in | Wt 146.2 lb

## 2019-07-07 DIAGNOSIS — Z01419 Encounter for gynecological examination (general) (routine) without abnormal findings: Secondary | ICD-10-CM | POA: Insufficient documentation

## 2019-07-07 MED ORDER — PROGESTERONE MICRONIZED 100 MG PO CAPS: 100.0000 mg | ORAL_CAPSULE | Freq: Every day | ORAL | 3 refills | Status: DC

## 2019-07-07 MED ORDER — ESTRADIOL 0.0375 MG/24HR TD PTTW: MEDICATED_PATCH | TRANSDERMAL | 3 refills | Status: DC

## 2019-07-08 LAB — CBC
Hematocrit: 44.2 % (ref 34.0–46.6)
Hemoglobin: 15 g/dL (ref 11.1–15.9)
MCH: 30.9 pg (ref 26.6–33.0)
MCHC: 33.9 g/dL (ref 31.5–35.7)
MCV: 91 fL (ref 79–97)
Platelets: 223 10*3/uL (ref 150–450)
RBC: 4.85 x10E6/uL (ref 3.77–5.28)
RDW: 12.8 % (ref 11.7–15.4)
WBC: 6.5 10*3/uL (ref 3.4–10.8)

## 2019-07-08 LAB — CYTOLOGY - PAP
Comment: NEGATIVE
Diagnosis: UNDETERMINED — AB
High risk HPV: POSITIVE — AB

## 2019-07-08 LAB — COMPREHENSIVE METABOLIC PANEL
ALT: 15 IU/L (ref 0–32)
AST: 20 IU/L (ref 0–40)
Albumin/Globulin Ratio: 1.8 (ref 1.2–2.2)
Albumin: 4.6 g/dL (ref 3.8–4.8)
Alkaline Phosphatase: 56 IU/L (ref 48–121)
BUN/Creatinine Ratio: 16 (ref 12–28)
BUN: 12 mg/dL (ref 8–27)
Bilirubin Total: 0.5 mg/dL (ref 0.0–1.2)
CO2: 25 mmol/L (ref 20–29)
Calcium: 9.3 mg/dL (ref 8.7–10.3)
Chloride: 100 mmol/L (ref 96–106)
Creatinine, Ser: 0.77 mg/dL (ref 0.57–1.00)
GFR calc Af Amer: 95 mL/min/{1.73_m2} (ref >59)
GFR calc non Af Amer: 82 mL/min/{1.73_m2} (ref >59)
Globulin, Total: 2.5 g/dL (ref 1.5–4.5)
Glucose: 86 mg/dL (ref 65–99)
Potassium: 4.5 mmol/L (ref 3.5–5.2)
Sodium: 141 mmol/L (ref 134–144)
Total Protein: 7.1 g/dL (ref 6.0–8.5)

## 2019-07-08 LAB — LIPID PANEL
Chol/HDL Ratio: 3 ratio (ref 0.0–4.4)
Cholesterol, Total: 222 mg/dL — ABNORMAL HIGH (ref 100–199)
HDL: 75 mg/dL (ref >39)
LDL Chol Calc (NIH): 133 mg/dL — ABNORMAL HIGH (ref 0–99)
Triglycerides: 81 mg/dL (ref 0–149)
VLDL Cholesterol Cal: 14 mg/dL (ref 5–40)

## 2019-07-08 LAB — VITAMIN D 25 HYDROXY (VIT D DEFICIENCY, FRACTURES): Vit D, 25-Hydroxy: 80.5 ng/mL (ref 30.0–100.0)

## 2019-07-08 LAB — TSH: TSH: 1.68 u[IU]/mL (ref 0.450–4.500)

## 2019-07-10 ENCOUNTER — Telehealth: Payer: Self-pay | Admitting: *Deleted

## 2019-07-10 DIAGNOSIS — R87610 Atypical squamous cells of undetermined significance on cytologic smear of cervix (ASC-US): Secondary | ICD-10-CM

## 2019-07-10 NOTE — Telephone Encounter (Signed)
-----   Message from Nunzio Cobbs, MD sent at 07/09/2019 12:53 PM EDT ----- Please contact patient with results of testing.   Her pap showed ASCUS and she had a positive HR HPV.  I am recommending colposcopy with me.  Please precert and schedule.  She has a history of LGSIL and had a colposcopy in the past.   Her cholesterol total and LDL are elevated at 222 and 133, respectively.  Her cholesterol ratios overall are good.  This can be lowered through a diet low in cholesterol and through vigorous exercise.   Her vit D is on the high side of normal at 80.5. She may be able to decrease her vitamin D to 600 IU daily.  Her blood counts, blood chemistries, and thyroid testing are normal.

## 2019-07-10 NOTE — Telephone Encounter (Signed)
Burnice Logan, RN  07/10/2019 1:47 PM EDT Back to Top    Left message to call Sharee Pimple, RN at [place]

## 2019-07-11 ENCOUNTER — Telehealth: Payer: Self-pay | Admitting: Obstetrics and Gynecology

## 2019-07-11 NOTE — Telephone Encounter (Signed)
Call placed to convey benefits for colposcopy. °

## 2019-07-11 NOTE — Telephone Encounter (Signed)
Spoke with patient. Advised of results and recommendations per Dr. Quincy Simmonds.  Patient is currently taking Vit D 5,000 IU daily, will reduce to 600 IU daily.  Brief explanation of colpo provided, patient agreeable to proceed with scheduling after 08/04/19. Colpo scheduled for 7/19 at 4pm with Dr. Quincy Simmonds. Order placed for precert. Advised to take Motrin 800 mg with food and water one hour before procedure. Patient verbalizes understanding and is agreeable.   Routing to provider for final review. Patient is agreeable to disposition. Will close encounter.  Cc: Magdalene Patricia, Hayley Carder

## 2019-07-23 NOTE — Telephone Encounter (Signed)
Second call placed to convey benefits for 08/11/19  scheduled appointment.

## 2019-07-31 NOTE — Telephone Encounter (Signed)
Third call to convey benefits for 08/11/19 appointment. Ok per DPR  to leave a full detailed message.

## 2019-08-01 NOTE — Telephone Encounter (Signed)
Patient returned my call and I conveyed the benefits. Patient understands/agreeable with the benefits. Patient is aware of the cancellation policy. Appointment scheduled 08/11/19.

## 2019-08-05 ENCOUNTER — Telehealth: Payer: Self-pay

## 2019-08-05 ENCOUNTER — Encounter: Payer: Self-pay | Admitting: Obstetrics and Gynecology

## 2019-08-05 ENCOUNTER — Ambulatory Visit (INDEPENDENT_AMBULATORY_CARE_PROVIDER_SITE_OTHER): Payer: 59 | Admitting: Obstetrics and Gynecology

## 2019-08-05 ENCOUNTER — Other Ambulatory Visit: Payer: Self-pay

## 2019-08-05 VITALS — BP 122/60 | HR 64 | Ht 64.25 in | Wt 147.2 lb

## 2019-08-05 DIAGNOSIS — R87610 Atypical squamous cells of undetermined significance on cytologic smear of cervix (ASC-US): Secondary | ICD-10-CM | POA: Diagnosis not present

## 2019-08-05 DIAGNOSIS — R350 Frequency of micturition: Secondary | ICD-10-CM | POA: Diagnosis not present

## 2019-08-05 DIAGNOSIS — N3090 Cystitis, unspecified without hematuria: Secondary | ICD-10-CM | POA: Diagnosis not present

## 2019-08-05 DIAGNOSIS — R87810 Cervical high risk human papillomavirus (HPV) DNA test positive: Secondary | ICD-10-CM

## 2019-08-05 DIAGNOSIS — R8290 Unspecified abnormal findings in urine: Secondary | ICD-10-CM | POA: Diagnosis not present

## 2019-08-05 LAB — POCT URINALYSIS DIPSTICK
Bilirubin, UA: NEGATIVE
Glucose, UA: NEGATIVE
Ketones, UA: NEGATIVE
Nitrite, UA: NEGATIVE
Protein, UA: NEGATIVE
Urobilinogen, UA: 0.2 E.U./dL
pH, UA: 5 (ref 5.0–8.0)

## 2019-08-05 MED ORDER — SULFAMETHOXAZOLE-TRIMETHOPRIM 800-160 MG PO TABS: 1.0000 | ORAL_TABLET | Freq: Two times a day (BID) | ORAL | 0 refills | Status: DC

## 2019-08-05 MED ORDER — PHENAZOPYRIDINE HCL 200 MG PO TABS
200.0000 mg | ORAL_TABLET | Freq: Three times a day (TID) | ORAL | 0 refills | Status: DC | PRN
Start: 2019-08-05 — End: 2019-08-11

## 2019-08-05 NOTE — Telephone Encounter (Signed)
AEX 07/07/19 PMP, on HRT H/o endometrial ablation  Spoke with pt. Pt states having UTI sx of urinary urgency, frequency, and bladder pressure x 2 days now. Pt denies any fever, chills, back pain, vaginal discharge or odor. Pt states only taking cranberry juice and not resolved sx. Pt denies taking any OTC meds for treatment. Advised pt to have OV for further evaluation. Pt agreeable. Pt scheduled OV with Dr Quincy Simmonds at 08/05/19 at 2:30 pm. Pt agreeable and verbalized understanding. Pt advised to increase water and take Ibuprofen to help before appt. Pt agreeable.  CPS neg.   Routing to Dr Quincy Simmonds for review.  Encounter closed.

## 2019-08-05 NOTE — Telephone Encounter (Signed)
Opened in error

## 2019-08-05 NOTE — Progress Notes (Signed)
GYNECOLOGY  VISIT   HPI: 64 y.o.   Married  Caucasian  female   G2P2 with Patient's last menstrual period was 01/24/2003 (approximate).   here for urinary frequency and pressure.   Up three times last night to void.  Feels like she is not voiding well.  Stung to void this am.  No blood in urine.   Some back pain, not new.  No nausea or fever.   Ibuprofen helped the pain, but it recurred.  Urine Dip: 2+WBCs, 1+RBCs  GYNECOLOGIC HISTORY: Patient's last menstrual period was 01/24/2003 (approximate). Contraception: Tubal Menopausal hormone therapy: Vivelle Dot, Prometrium Last mammogram: 09-18-18 Neg/density C/Birads1 Last pap smear:  06/27/19 - LGSIL, positive HR HPV, 04-12-16 Neg:Neg HR HPV, 03-10-13 Neg:Neg HR HPV        OB History    Gravida  2   Para  2   Term      Preterm      AB      Living  2     SAB      TAB      Ectopic      Multiple      Live Births                 There are no problems to display for this patient.   Past Medical History:  Diagnosis Date  . Abnormal Pap smear of cervix 1999   Cryo  . Amenorrhea   . Cancer [facility]) 2009   Sq cell cancer Left face  . GERD (gastroesophageal reflux disease)   . H/O gastric ulcer   . Menorrhagia 3/05  . MVP (mitral valve prolapse)   . Vitamin D deficiency 03/2016    Past Surgical History:  Procedure Laterality Date  . ABLATION  06/15/03   HTA    . Birth Mark Removed     foot  . COLPOSCOPY  01/2009   CIN 1  . ENDOMETRIAL BIOPSY  03/2003   benign  . GYNECOLOGIC CRYOSURGERY  1999  . TUBAL LIGATION     BTL    Current Outpatient Medications  Medication Sig Dispense Refill  . Cholecalciferol (VITAMIN D3) 1000 units CAPS Take 1 capsule by mouth daily.    . cycloSPORINE (RESTASIS) 0.05 % ophthalmic emulsion 1 drop 2 (two) times daily.    Marland Kitchen DEXILANT 60 MG capsule Take 1 tablet by mouth daily. Patient takes qod  11  . estradiol (VIVELLE-DOT) 0.0375 MG/24HR APPLY 1 PATCH EXTERNALLY TO THE SKIN 2  TIMES A WEEK 24 patch 3  . progesterone (PROMETRIUM) 100 MG capsule Take 1 capsule (100 mg total) by mouth daily. 90 capsule 3  . tetracycline (ACHROMYCIN,SUMYCIN) 500 MG capsule Take 500 mg by mouth as needed. Reported on 03/24/2015--prn for Rosacae     No current facility-administered medications for this visit.     ALLERGIES: Patient has no known allergies.  Family History  Problem Relation Age of Onset  . Hypertension Mother   . Anemia Mother   . Heart disease Mother   . Heart disease Father   . Cancer Brother        Lia Hopping  4 Sarcoma--Dec age 66  . Breast cancer Maternal Aunt     Social History   Socioeconomic History  . Marital status: Married    Spouse name: Not on file  . Number of children: Not on file  . Years of education: Not on file  . Highest education level: Not on file  Occupational History  .  Not on file  Tobacco Use  . Smoking status: Never Smoker  . Smokeless tobacco: Never Used  Vaping Use  . Vaping Use: Never used  Substance and Sexual Activity  . Alcohol use: Yes    Alcohol/week: 2.0 standard drinks    Types: 2 Standard drinks or equivalent per week    Comment: 1-2 drinks a week (alcohol or wine)  . Drug use: No  . Sexual activity: Yes    Partners: Male    Birth control/protection: Post-menopausal, Surgical    Comment: BTL  Other Topics Concern  . Not on file  Social History Narrative  . Not on file   Social Determinants of Health   Financial Resource Strain:   . Difficulty of Paying Living Expenses:   Food Insecurity:   . Worried About Charity fundraiser in the Last Year:   . Arboriculturist in the Last Year:   Transportation Needs:   . Film/video editor (Medical):   Marland Kitchen Lack of Transportation (Non-Medical):   Physical Activity:   . Days of Exercise per Week:   . Minutes of Exercise per Session:   Stress:   . Feeling of Stress :   Social Connections:   . Frequency of Communication with Friends and Family:   . Frequency of  Social Gatherings with Friends and Family:   . Attends Religious Services:   . Active Member of Clubs or Organizations:   . Attends Archivist Meetings:   Marland Kitchen Marital Status:   Intimate Partner Violence:   . Fear of Current or Ex-Partner:   . Emotionally Abused:   Marland Kitchen Physically Abused:   . Sexually Abused:     Review of Systems  Genitourinary: Positive for frequency.  All other systems reviewed and are negative.   PHYSICAL EXAMINATION:    BP 122/60   Pulse 64   Ht 5' 4.25" (1.632 m)   Wt 147 lb 3.2 oz (66.8 kg)   LMP 01/24/2003 (Approximate)   BMI 25.07 kg/m     General appearance: alert, cooperative and appears stated age  ASSESSMENT  Cystitis.  ASCUS pap and positive HR HPV  PLAN  Urine micro and cx.  Bactrim DS po bid x 3 days.  Pyridium 200 mg po tid x 2 - 3 days. Hydrate well.  Call if symptoms do not improve.  We discussed her abnormal pap, HPV, colposcopy, and LEEP.  She will return for her colposcopy next week.   ___16____ minutes consultation.

## 2019-08-05 NOTE — Telephone Encounter (Signed)
Patient is calling in regards to having UTI symptoms. Patient would like to discuss with nurse.

## 2019-08-05 NOTE — Telephone Encounter (Signed)
Spoke with pt. Pt states took Ibuprofen and increased water intake and feeling much better, but still having slight bladder pressure. Pt wanting to know if needs to keep appt. Pt advised to be seen for evaluation. Pt agreeable and states will keep appt on 7/13 at 230pm today   Addendum closed.

## 2019-08-05 NOTE — Patient Instructions (Signed)

## 2019-08-06 LAB — URINALYSIS, MICROSCOPIC ONLY
Bacteria, UA: NONE SEEN
Casts: NONE SEEN /lpf
Epithelial Cells (non renal): >10 /hpf — AB (ref 0–10)

## 2019-08-06 NOTE — Progress Notes (Signed)
  Subjective:     Patient ID: Misty Odom, female   DOB: November 15, 1955, 64 y.o.   MRN: [id number]  HPI  Patient here today for colposcopy with pap 07-07-19 showing ASCUS:Pos HR HPV.  Patient states UTI symptoms improved with Bactrim. She felt much better until yesterday and began feeling some lower pressure again. The Bactrim did work but symptoms returned.   Pap history: 04-12-16 Neg:Neg HR HPV 03-10-13 Neg:Neg HR HPV 12-24-10 Colpo Bx - squamous epithelium w/HPV effect. 01-26-10 LSIL pap 01-27-09 colpo bx - LGSIL, ECC w/fragment of atypia 122010 LSIL pap 1999 remote hx of cryotherapy to cervix  Urine dip - Trace RBC, trace WBC.   Review of Systems  LMP:PMP Contraception: BTL     Objective:   Physical Exam Genitourinary:       Colposcopy Consent for procedure.  3% acetic acid used along with white light and green light filter.  Colposcopy satisfactory.  No lesions seen of the cervix or vagina.  ECC collected and sent to pathology.  Minimal EBL.  Vulvar colposcopy revealed no lesions. No complications.     Assessment:     ASCUS pap and positive HR HPV. Pelvic pressure.  Recurrent UTI symptoms.  Abnormal urine dip.     Plan:     FU ECC.  Anticipate cervical cancer screening in one year.  UC sent.  Will treat with Macrobid 100 mg po bid x 7 days.  Return if symptoms persist.  10 minutes consultation regarding UTI symptoms.

## 2019-08-08 LAB — URINE CULTURE

## 2019-08-11 ENCOUNTER — Ambulatory Visit (INDEPENDENT_AMBULATORY_CARE_PROVIDER_SITE_OTHER): Payer: 59 | Admitting: Obstetrics and Gynecology

## 2019-08-11 ENCOUNTER — Other Ambulatory Visit (HOSPITAL_COMMUNITY)
Admission: RE | Admit: 2019-08-11 | Discharge: 2019-08-11 | Disposition: A | Discharge status: Home / Self Care | Payer: 59 | Source: Ambulatory Visit | Attending: Obstetrics and Gynecology | Admitting: Obstetrics and Gynecology

## 2019-08-11 ENCOUNTER — Encounter: Payer: Self-pay | Admitting: Obstetrics and Gynecology

## 2019-08-11 ENCOUNTER — Other Ambulatory Visit: Payer: Self-pay | Admitting: Obstetrics and Gynecology

## 2019-08-11 ENCOUNTER — Other Ambulatory Visit: Payer: Self-pay

## 2019-08-11 VITALS — BP 120/72 | HR 70 | Ht 64.25 in | Wt 147.0 lb

## 2019-08-11 DIAGNOSIS — R102 Pelvic and perineal pain: Secondary | ICD-10-CM

## 2019-08-11 DIAGNOSIS — R87610 Atypical squamous cells of undetermined significance on cytologic smear of cervix (ASC-US): Secondary | ICD-10-CM | POA: Diagnosis not present

## 2019-08-11 DIAGNOSIS — R87810 Cervical high risk human papillomavirus (HPV) DNA test positive: Secondary | ICD-10-CM

## 2019-08-11 LAB — POCT URINALYSIS DIPSTICK
Bilirubin, UA: NEGATIVE
Glucose, UA: NEGATIVE
Ketones, UA: NEGATIVE
Nitrite, UA: NEGATIVE
Protein, UA: NEGATIVE
Urobilinogen, UA: 0.2 E.U./dL
pH, UA: 5 (ref 5.0–8.0)

## 2019-08-11 MED ORDER — NITROFURANTOIN MONOHYD MACRO 100 MG PO CAPS
100.0000 mg | ORAL_CAPSULE | Freq: Two times a day (BID) | ORAL | 0 refills | Status: DC
Start: 2019-08-11 — End: 2020-08-04

## 2019-08-11 NOTE — Patient Instructions (Signed)
Colposcopy, Care After This sheet gives you information about how to care for yourself after your procedure. Your doctor may also give you more specific instructions. If you have problems or questions, contact your doctor. What can I expect after the procedure? If you did not have a tissue sample removed (did not have a biopsy), you may only have some spotting for a few days. You can go back to your normal activities. If you had a tissue sample removed, it is common to have:  Soreness and pain. This may last for a few days.  Light-headedness.  Mild bleeding from your vagina or dark-colored, grainy discharge from your vagina. This may last for a few days. You may need to wear a sanitary pad.  Spotting for at least 48 hours after the procedure. Follow these instructions at home:   Take over-the-counter and prescription medicines only as told by your doctor. Ask your doctor what medicines you can start taking again. This is very important if you take blood-thinning medicine.  Do not drive or use heavy machinery while taking prescription pain medicine.  For 3 days, or as long as your doctor tells you, avoid: ? Douching. ? Using tampons. ? Having sex.  If you use birth control (contraception), keep using it.  Limit activity for the first day after the procedure. Ask your doctor what activities are safe for you.  It is up to you to get the results of your procedure. Ask your doctor when your results will be ready.  Keep all follow-up visits as told by your doctor. This is important. Contact a doctor if:  You get a skin rash. Get help right away if:  You are bleeding a lot from your vagina. It is a lot of bleeding if you are using more than one pad an hour for 2 hours in a row.  You have clumps of blood (blood clots) coming from your vagina.  You have a fever.  You have chills  You have pain in your lower belly (pelvic area).  You have signs of infection, such as vaginal  discharge that is: ? Different than usual. ? Yellow. ? Bad-smelling.  You have very pain or cramps in your lower belly that do not get better with medicine.  You feel light-headed.  You feel dizzy.  You pass out (faint). Summary  If you did not have a tissue sample removed (did not have a biopsy), you may only have some spotting for a few days. You can go back to your normal activities.  If you had a tissue sample removed, it is common to have mild pain and spotting for 48 hours.  For 3 days, or as long as your doctor tells you, avoid douching, using tampons and having sex.  Get help right away if you have bleeding, very bad pain, or signs of infection. This information is not intended to replace advice given to you by your health care provider. Make sure you discuss any questions you have with your health care provider. Document Revised: 12/22/2016 Document Reviewed: 09/29/2015 Elsevier Patient Education  2020 [employer] Inc[employer]  

## 2019-08-13 LAB — URINE CULTURE

## 2019-08-13 LAB — SURGICAL PATHOLOGY

## 2019-08-14 ENCOUNTER — Telehealth: Payer: Self-pay | Admitting: *Deleted

## 2019-08-14 NOTE — Telephone Encounter (Addendum)
-----   Message from Nunzio Cobbs, MD sent at 08/14/2019  9:44 AM EDT ----- Please contact patient with results of her colposcopy.   Her biopsy was benign.  By ASCCP guidelines, she needs cervical cancer screening in one year.  Please place 12 month recall.   See separate note for her UC results which were negative.    Nunzio Cobbs, MD  08/14/2019 9:40 AM EDT     Please contact patient with results of her urine culture which is negative for infection.   It is ok for her to complete her antibiotic course.   Have her return if her symptoms do not resolve.  See separate note for her colposcopy results.

## 2019-08-14 NOTE — Telephone Encounter (Signed)
Spoke with patient, advised of all results as seen below per Dr. Quincy Simmonds.  Patient reports symptoms have improved, declines an OV at this time. Is aware to return call to office if symptoms persist or new symptoms develop.  Next AEX 08/04/20. Patient verbalizes understanding and is agreeable.   Encounter closed.

## 2019-08-14 NOTE — Telephone Encounter (Signed)
Burnice Logan, RN  08/14/2019 10:45 AM EDT Back to Top    Left message to call Sharee Pimple, RN at [place].   12 recall placed.   See telephone encounter dated 08/14/19

## 2019-08-14 NOTE — Telephone Encounter (Signed)
Patient left message returning call to [place].

## 2019-08-19 ENCOUNTER — Other Ambulatory Visit: Payer: Self-pay | Admitting: Obstetrics and Gynecology

## 2019-08-19 DIAGNOSIS — Z1231 Encounter for screening mammogram for malignant neoplasm of breast: Secondary | ICD-10-CM

## 2019-09-22 ENCOUNTER — Other Ambulatory Visit: Payer: Self-pay

## 2019-09-22 ENCOUNTER — Ambulatory Visit
Admission: RE | Admit: 2019-09-22 | Discharge: 2019-09-22 | Disposition: A | Discharge status: Home / Self Care | Payer: 59 | Source: Ambulatory Visit | Attending: Obstetrics and Gynecology | Admitting: Obstetrics and Gynecology

## 2019-09-22 DIAGNOSIS — Z1231 Encounter for screening mammogram for malignant neoplasm of breast: Secondary | ICD-10-CM

## 2020-07-11 ENCOUNTER — Other Ambulatory Visit: Payer: Self-pay | Admitting: Obstetrics and Gynecology

## 2020-07-12 NOTE — Telephone Encounter (Signed)
Annual exam scheduled on 08/04/20 Last Mammogram 08/2019

## 2020-07-29 NOTE — Progress Notes (Signed)
65 y.o. G2P2 Married Caucasian female here for annual exam.    Wants to continue her HRT for improved quality of life and sexual functioning.   Husband recently tested positive for Covid.  She tested negative for Covid.  She received her second booster for Covid.   Mother is [place] and is 67 yo.   Daughter married in [place].   Labs with PCP.   PCP:   Donald Prose, MD  Patient's last menstrual period was 01/24/2003 (approximate).           Sexually active: Yes.    The current method of family planning is none.    Exercising: Yes.    Gym. Smoker:  no  Health Maintenance: Pap:  07-07-19 ASCUS pos HR HPV--08-11-19 colpo ECC neg History of abnormal Pap:  yes MMG: 09-22-19 normal  Colonoscopy:  02-2015 polyp removed.  Will do colonoscopy/endoscopy in Nov. BMD:   never   TDaP:  06-21-18 Gardasil:   no HIV:neg Hep C:neg Screening Labs:  Hb today: PCP, Urine today: PCP   reports that she has never smoked. She has never used smokeless tobacco. She reports current alcohol use of about 2.0 standard drinks of alcohol per week. She reports that she does not use drugs.  Past Medical History:  Diagnosis Date   Abnormal Pap smear of cervix 1999   Cryo   Amenorrhea    Cancer [facility]) 2009   Sq cell cancer Left face   GERD (gastroesophageal reflux disease)    H/O gastric ulcer    Menorrhagia 3/05   MVP (mitral valve prolapse)    Vitamin D deficiency 03/2016    Past Surgical History:  Procedure Laterality Date   ABLATION  06/15/03   HTA     Birth Mark Removed     foot   COLPOSCOPY  01/2009   CIN 1   ENDOMETRIAL BIOPSY  03/2003   benign   GYNECOLOGIC CRYOSURGERY  1999   TUBAL LIGATION     BTL    Current Outpatient Medications  Medication Sig Dispense Refill   Cholecalciferol (VITAMIN D3) 1000 units CAPS Take 1 capsule by mouth daily.     cycloSPORINE (RESTASIS) 0.05 % ophthalmic emulsion 1 drop 2 (two) times daily.     DEXILANT 60 MG capsule Take 1 tablet by mouth  daily. Patient takes qod  11   estradiol (VIVELLE-DOT) 0.0375 MG/24HR APPLY 1 PATCH EXTERNALLY TO THE SKIN 2 TIMES A WEEK 24 patch 3   progesterone (PROMETRIUM) 100 MG capsule TAKE 1 CAPSULE(100 MG) BY MOUTH DAILY 30 capsule 0   tetracycline (ACHROMYCIN,SUMYCIN) 500 MG capsule Take 500 mg by mouth as needed. Reported on 03/24/2015--prn for Rosacae     No current facility-administered medications for this visit.    Family History  Problem Relation Age of Onset   Hypertension Mother    Anemia Mother    Heart disease Mother    Heart disease Father    Cancer Brother        Lia Hopping  25 Sarcoma--Dec age 52   Breast cancer Maternal Aunt     Review of Systems  All other systems reviewed and are negative.  Exam:   BP 134/80 (BP Location: Right Arm, Patient Position: Sitting, Cuff Size: Normal)   Pulse 73   Ht 5\' 4"  (1.626 m)   Wt 144 lb (65.3 kg)   LMP 01/24/2003 (Approximate)   SpO2 99%   BMI 24.72 kg/m     General appearance: alert, cooperative and appears  stated age Head: normocephalic, without obvious abnormality, atraumatic Neck: no adenopathy, supple, symmetrical, trachea midline and thyroid normal to inspection and palpation Lungs: clear to auscultation bilaterally Breasts: normal appearance, no masses or tenderness, No nipple retraction or dimpling, No nipple discharge or bleeding, No axillary adenopathy Heart: regular rate and rhythm Abdomen: soft, non-tender; no masses, no organomegaly Extremities: extremities normal, atraumatic, no cyanosis or edema Skin: skin color, texture, turgor normal. No rashes or lesions Lymph nodes: cervical, supraclavicular, and axillary nodes normal. Neurologic: grossly normal  Pelvic: External genitalia:  no lesions              No abnormal inguinal nodes palpated.              Urethra:  normal appearing urethra with no masses, tenderness or lesions              Bartholins and Skenes: normal                 Vagina: normal appearing vagina with  normal color and discharge, no lesions              Cervix: no lesions              Pap taken: yes. Bimanual Exam:  Uterus:  normal size, contour, position, consistency, mobility, non-tender              Adnexa: no mass, fullness, tenderness              Rectal exam: yes.  Confirms.              Anus:  normal sphincter tone, no lesions  Chaperone was present for exam.  Assessment:   Pelvic exam without abnormal findings.  Cervical cancer screening.  Hx LGSIL and positive HR HPV. HRT patient. Status post endometrial ablation.  Screening breast exam. Rectal exam. HRT. Menopause. Osteoporosis screening.  FH CAD.  Plan: Mammogram screening discussed. Self breast awareness reviewed. Pap and HR HPV as above. Guidelines for Calcium, Vitamin D, regular exercise program including cardiovascular and weight bearing exercise. [facility] ordered for [facility].  Osteoporosis screening discussed.  Refill of HRT:  Vivelle Dot and Prometrium for one year.  Discused WHI and use of HRT which can increase risk of PE, DVT, MI, stroke and breast cancer.   Follow up annually and prn.   30 min total time was spent for this patient encounter, including preparation, face-to-face counseling with the patient, coordination of care, and documentation of the encounter.

## 2020-08-04 ENCOUNTER — Other Ambulatory Visit (HOSPITAL_COMMUNITY)
Admission: RE | Admit: 2020-08-04 | Discharge: 2020-08-04 | Disposition: A | Discharge status: Home / Self Care | Payer: PPO | Source: Ambulatory Visit | Attending: Obstetrics and Gynecology | Admitting: Obstetrics and Gynecology

## 2020-08-04 ENCOUNTER — Other Ambulatory Visit: Payer: Self-pay

## 2020-08-04 ENCOUNTER — Ambulatory Visit (INDEPENDENT_AMBULATORY_CARE_PROVIDER_SITE_OTHER): Payer: PPO | Admitting: Obstetrics and Gynecology

## 2020-08-04 ENCOUNTER — Encounter: Payer: Self-pay | Admitting: Obstetrics and Gynecology

## 2020-08-04 VITALS — BP 134/80 | HR 73 | Ht 64.0 in | Wt 144.0 lb

## 2020-08-04 DIAGNOSIS — Z23 Encounter for immunization: Secondary | ICD-10-CM | POA: Diagnosis not present

## 2020-08-04 DIAGNOSIS — Z124 Encounter for screening for malignant neoplasm of cervix: Secondary | ICD-10-CM | POA: Insufficient documentation

## 2020-08-04 DIAGNOSIS — Z01419 Encounter for gynecological examination (general) (routine) without abnormal findings: Secondary | ICD-10-CM | POA: Insufficient documentation

## 2020-08-04 DIAGNOSIS — Z9189 Other specified personal risk factors, not elsewhere classified: Secondary | ICD-10-CM

## 2020-08-04 DIAGNOSIS — Z79890 Hormone replacement therapy (postmenopausal): Secondary | ICD-10-CM | POA: Diagnosis not present

## 2020-08-04 DIAGNOSIS — Z8619 Personal history of other infectious and parasitic diseases: Secondary | ICD-10-CM | POA: Insufficient documentation

## 2020-08-04 DIAGNOSIS — Z87410 Personal history of cervical dysplasia: Secondary | ICD-10-CM | POA: Diagnosis not present

## 2020-08-04 DIAGNOSIS — K219 Gastro-esophageal reflux disease without esophagitis: Secondary | ICD-10-CM | POA: Diagnosis not present

## 2020-08-04 DIAGNOSIS — Z1151 Encounter for screening for human papillomavirus (HPV): Secondary | ICD-10-CM | POA: Insufficient documentation

## 2020-08-04 DIAGNOSIS — Z1239 Encounter for other screening for malignant neoplasm of breast: Secondary | ICD-10-CM

## 2020-08-04 DIAGNOSIS — R87810 Cervical high risk human papillomavirus (HPV) DNA test positive: Secondary | ICD-10-CM | POA: Diagnosis not present

## 2020-08-04 DIAGNOSIS — Z13820 Encounter for screening for osteoporosis: Secondary | ICD-10-CM | POA: Diagnosis not present

## 2020-08-04 DIAGNOSIS — I10 Essential (primary) hypertension: Secondary | ICD-10-CM | POA: Diagnosis not present

## 2020-08-04 DIAGNOSIS — Z1389 Encounter for screening for other disorder: Secondary | ICD-10-CM | POA: Diagnosis not present

## 2020-08-04 DIAGNOSIS — Z780 Asymptomatic menopausal state: Secondary | ICD-10-CM

## 2020-08-04 DIAGNOSIS — Z008 Encounter for other general examination: Secondary | ICD-10-CM

## 2020-08-04 DIAGNOSIS — L719 Rosacea, unspecified: Secondary | ICD-10-CM | POA: Diagnosis not present

## 2020-08-04 DIAGNOSIS — Z0000 Encounter for general adult medical examination without abnormal findings: Secondary | ICD-10-CM | POA: Diagnosis not present

## 2020-08-04 MED ORDER — ESTRADIOL 0.0375 MG/24HR TD PTTW: MEDICATED_PATCH | TRANSDERMAL | 3 refills | Status: DC

## 2020-08-04 MED ORDER — PROGESTERONE MICRONIZED 100 MG PO CAPS: ORAL_CAPSULE | ORAL | 3 refills | Status: DC

## 2020-08-04 NOTE — Patient Instructions (Signed)

## 2020-08-10 LAB — CYTOLOGY - PAP
Comment: NEGATIVE
Diagnosis: NEGATIVE
High risk HPV: NEGATIVE

## 2020-08-18 ENCOUNTER — Other Ambulatory Visit: Payer: Self-pay | Admitting: Obstetrics and Gynecology

## 2020-08-18 DIAGNOSIS — Z1231 Encounter for screening mammogram for malignant neoplasm of breast: Secondary | ICD-10-CM

## 2020-09-06 DIAGNOSIS — L570 Actinic keratosis: Secondary | ICD-10-CM | POA: Diagnosis not present

## 2020-09-06 DIAGNOSIS — D225 Melanocytic nevi of trunk: Secondary | ICD-10-CM | POA: Diagnosis not present

## 2020-09-06 DIAGNOSIS — X32XXXD Exposure to sunlight, subsequent encounter: Secondary | ICD-10-CM | POA: Diagnosis not present

## 2020-09-06 DIAGNOSIS — L820 Inflamed seborrheic keratosis: Secondary | ICD-10-CM | POA: Diagnosis not present

## 2020-09-06 DIAGNOSIS — Z1283 Encounter for screening for malignant neoplasm of skin: Secondary | ICD-10-CM | POA: Diagnosis not present

## 2020-09-13 ENCOUNTER — Other Ambulatory Visit: Payer: Self-pay | Admitting: Obstetrics and Gynecology

## 2020-09-13 DIAGNOSIS — Z780 Asymptomatic menopausal state: Secondary | ICD-10-CM

## 2020-09-13 DIAGNOSIS — Z13820 Encounter for screening for osteoporosis: Secondary | ICD-10-CM

## 2020-09-16 ENCOUNTER — Ambulatory Visit
Admission: RE | Admit: 2020-09-16 | Discharge: 2020-09-16 | Disposition: A | Discharge status: Home / Self Care | Payer: PPO | Source: Ambulatory Visit | Attending: Obstetrics and Gynecology | Admitting: Obstetrics and Gynecology

## 2020-09-16 ENCOUNTER — Other Ambulatory Visit: Payer: Self-pay

## 2020-09-16 DIAGNOSIS — Z0389 Encounter for observation for other suspected diseases and conditions ruled out: Secondary | ICD-10-CM | POA: Diagnosis not present

## 2020-09-16 DIAGNOSIS — Z13820 Encounter for screening for osteoporosis: Secondary | ICD-10-CM

## 2020-09-16 DIAGNOSIS — Z1231 Encounter for screening mammogram for malignant neoplasm of breast: Secondary | ICD-10-CM

## 2020-09-16 DIAGNOSIS — Z780 Asymptomatic menopausal state: Secondary | ICD-10-CM

## 2020-09-23 ENCOUNTER — Other Ambulatory Visit: Payer: Self-pay

## 2020-09-23 ENCOUNTER — Ambulatory Visit
Admission: RE | Admit: 2020-09-23 | Discharge: 2020-09-23 | Disposition: A | Discharge status: Home / Self Care | Payer: PPO | Source: Ambulatory Visit | Attending: Obstetrics and Gynecology | Admitting: Obstetrics and Gynecology

## 2020-09-23 DIAGNOSIS — Z1231 Encounter for screening mammogram for malignant neoplasm of breast: Secondary | ICD-10-CM | POA: Diagnosis not present

## 2020-11-17 DIAGNOSIS — L720 Epidermal cyst: Secondary | ICD-10-CM | POA: Diagnosis not present

## 2021-02-14 DIAGNOSIS — I781 Nevus, non-neoplastic: Secondary | ICD-10-CM | POA: Diagnosis not present

## 2021-02-14 DIAGNOSIS — L720 Epidermal cyst: Secondary | ICD-10-CM | POA: Diagnosis not present

## 2021-02-14 DIAGNOSIS — L820 Inflamed seborrheic keratosis: Secondary | ICD-10-CM | POA: Diagnosis not present

## 2021-03-10 DIAGNOSIS — K2970 Gastritis, unspecified, without bleeding: Secondary | ICD-10-CM | POA: Diagnosis not present

## 2021-03-10 DIAGNOSIS — K648 Other hemorrhoids: Secondary | ICD-10-CM | POA: Diagnosis not present

## 2021-03-10 DIAGNOSIS — K5730 Diverticulosis of large intestine without perforation or abscess without bleeding: Secondary | ICD-10-CM | POA: Diagnosis not present

## 2021-03-10 DIAGNOSIS — K219 Gastro-esophageal reflux disease without esophagitis: Secondary | ICD-10-CM | POA: Diagnosis not present

## 2021-03-10 DIAGNOSIS — K2270 Barrett's esophagus without dysplasia: Secondary | ICD-10-CM | POA: Diagnosis not present

## 2021-03-10 DIAGNOSIS — K317 Polyp of stomach and duodenum: Secondary | ICD-10-CM | POA: Diagnosis not present

## 2021-03-10 DIAGNOSIS — K2930 Chronic superficial gastritis without bleeding: Secondary | ICD-10-CM | POA: Diagnosis not present

## 2021-03-10 DIAGNOSIS — K621 Rectal polyp: Secondary | ICD-10-CM | POA: Diagnosis not present

## 2021-03-10 DIAGNOSIS — K635 Polyp of colon: Secondary | ICD-10-CM | POA: Diagnosis not present

## 2021-03-10 DIAGNOSIS — Z86010 Personal history of colonic polyps: Secondary | ICD-10-CM | POA: Diagnosis not present

## 2021-03-16 DIAGNOSIS — K219 Gastro-esophageal reflux disease without esophagitis: Secondary | ICD-10-CM | POA: Diagnosis not present

## 2021-03-16 DIAGNOSIS — K621 Rectal polyp: Secondary | ICD-10-CM | POA: Diagnosis not present

## 2021-03-16 DIAGNOSIS — K2930 Chronic superficial gastritis without bleeding: Secondary | ICD-10-CM | POA: Diagnosis not present

## 2021-03-16 DIAGNOSIS — K635 Polyp of colon: Secondary | ICD-10-CM | POA: Diagnosis not present

## 2021-06-13 DIAGNOSIS — L720 Epidermal cyst: Secondary | ICD-10-CM | POA: Diagnosis not present

## 2021-07-27 ENCOUNTER — Telehealth: Payer: Self-pay | Admitting: *Deleted

## 2021-07-27 MED ORDER — PROGESTERONE MICRONIZED 100 MG PO CAPS: ORAL_CAPSULE | ORAL | 0 refills | Status: DC

## 2021-07-27 NOTE — Telephone Encounter (Signed)
Ok for Prometrium 100 mg po q hs.  Disp:  #30 RF:  none

## 2021-07-27 NOTE — Telephone Encounter (Signed)
Left detailed message on voicemail Rx sent.

## 2021-07-27 NOTE — Telephone Encounter (Signed)
Patient has office visit 1 year follow up scheduled on 08/10/21. Patient said she will run out of progesterone by then. She asked if you would provide her with a refill to get her until that visit? Please advise

## 2021-08-05 DIAGNOSIS — L821 Other seborrheic keratosis: Secondary | ICD-10-CM | POA: Diagnosis not present

## 2021-08-05 DIAGNOSIS — L578 Other skin changes due to chronic exposure to nonionizing radiation: Secondary | ICD-10-CM | POA: Diagnosis not present

## 2021-08-05 DIAGNOSIS — D2239 Melanocytic nevi of other parts of face: Secondary | ICD-10-CM | POA: Diagnosis not present

## 2021-08-05 DIAGNOSIS — Z808 Family history of malignant neoplasm of other organs or systems: Secondary | ICD-10-CM | POA: Diagnosis not present

## 2021-08-05 DIAGNOSIS — L570 Actinic keratosis: Secondary | ICD-10-CM | POA: Diagnosis not present

## 2021-08-05 DIAGNOSIS — Z85828 Personal history of other malignant neoplasm of skin: Secondary | ICD-10-CM | POA: Diagnosis not present

## 2021-08-05 DIAGNOSIS — L720 Epidermal cyst: Secondary | ICD-10-CM | POA: Diagnosis not present

## 2021-08-05 DIAGNOSIS — D225 Melanocytic nevi of trunk: Secondary | ICD-10-CM | POA: Diagnosis not present

## 2021-08-05 DIAGNOSIS — D2230 Melanocytic nevi of unspecified part of face: Secondary | ICD-10-CM | POA: Diagnosis not present

## 2021-08-09 DIAGNOSIS — K219 Gastro-esophageal reflux disease without esophagitis: Secondary | ICD-10-CM | POA: Diagnosis not present

## 2021-08-09 DIAGNOSIS — F321 Major depressive disorder, single episode, moderate: Secondary | ICD-10-CM | POA: Diagnosis not present

## 2021-08-09 DIAGNOSIS — Z1159 Encounter for screening for other viral diseases: Secondary | ICD-10-CM | POA: Diagnosis not present

## 2021-08-09 DIAGNOSIS — Z0000 Encounter for general adult medical examination without abnormal findings: Secondary | ICD-10-CM | POA: Diagnosis not present

## 2021-08-09 DIAGNOSIS — L719 Rosacea, unspecified: Secondary | ICD-10-CM | POA: Diagnosis not present

## 2021-08-09 DIAGNOSIS — Z23 Encounter for immunization: Secondary | ICD-10-CM | POA: Diagnosis not present

## 2021-08-09 DIAGNOSIS — I10 Essential (primary) hypertension: Secondary | ICD-10-CM | POA: Diagnosis not present

## 2021-08-09 NOTE — Progress Notes (Unsigned)
66 y.o. G2P2 Married Caucasian female here for pap and HRT medication follow up.    Had yearly exam with PCP yesterday.  We follow patient for her HRT medications. Using estrogen patch once a week.  Taking Prometrium daily.   Patient's mom in bad health and patient very stressed. She finds herself emotional and crying often.    PCP prescribed Effexor yesterday. Running is helping her.   PCP: Donald Prose, MD    Patient's last menstrual period was 01/24/2003 (approximate).           Sexually active: Yes.    The current method of family planning is Tubal/post menopausal status.    Exercising: Yes.     Gym 2 days/week, walks dog Smoker:  no  Health Maintenance: Pap:  08-04-20 Neg:Neg HR HPV, 07-07-19 ASCUS pos HR HPV--08-11-19 colpo ECC neg History of abnormal Pap: yes, Hx of cryotherapy in her 54's MMG:  09-23-20 Neg/BiRads1 Colonoscopy:  01/2021 normal--[facility] BMD: 09-17-19   Result :Normal TDaP:  06-21-18 Gardasil:   n/a HIV: Neg in past Hep C: Neg in past Screening Labs:  PCP   reports that she has never smoked. She has never used smokeless tobacco. She reports current alcohol use of about 4.0 standard drinks of alcohol per week. She reports that she does not use drugs.  Past Medical History:  Diagnosis Date   Abnormal Pap smear of cervix 1999   Cryo   Amenorrhea    Cancer [facility]) 2009   Sq cell cancer Left face   GERD (gastroesophageal reflux disease)    H/O gastric ulcer    Menorrhagia 3/05   MVP (mitral valve prolapse)    Vitamin D deficiency 03/2016    Past Surgical History:  Procedure Laterality Date   ABLATION  06/15/03   HTA     Birth Mark Removed     foot   COLPOSCOPY  01/2009   CIN 1   ENDOMETRIAL BIOPSY  03/2003   benign   GYNECOLOGIC CRYOSURGERY  1999   TUBAL LIGATION     BTL    Current Outpatient Medications  Medication Sig Dispense Refill   Cholecalciferol (VITAMIN D3) 1000 units CAPS Take 1 capsule by mouth daily.     cycloSPORINE (RESTASIS)  0.05 % ophthalmic emulsion 1 drop 2 (two) times daily.     DEXILANT 60 MG capsule Take 1 tablet by mouth daily. Patient takes qod  11   estradiol (VIVELLE-DOT) 0.0375 MG/24HR APPLY 1 PATCH EXTERNALLY TO THE SKIN 2 TIMES A WEEK 24 patch 3   progesterone (PROMETRIUM) 100 MG capsule TAKE 1 CAPSULE(100 MG) BY MOUTH DAILY 30 capsule 0   tetracycline (SUMYCIN) 250 MG capsule Take 250 mg by mouth daily.     No current facility-administered medications for this visit.    Family History  Problem Relation Age of Onset   Hypertension Mother    Anemia Mother    Heart disease Mother    Heart disease Father    Cancer Brother        Lia Hopping  4 Sarcoma--Dec age 31   Breast cancer Maternal Aunt     Review of Systems  Psychiatric/Behavioral:  The patient is nervous/anxious.   All other systems reviewed and are negative.   Exam:   BP 130/80   Pulse 60   Ht '5\' 4"'$  (1.626 m)   Wt 147 lb (66.7 kg)   LMP 01/24/2003 (Approximate)   SpO2 96%   BMI 25.23 kg/m     General  appearance: alert, cooperative and appears stated age Head: normocephalic, without obvious abnormality, atraumatic Neck: no adenopathy, supple, symmetrical, trachea midline and thyroid normal to inspection and palpation Lungs: clear to auscultation bilaterally Breasts: normal appearance, no masses or tenderness, No nipple retraction or dimpling, No nipple discharge or bleeding, No axillary adenopathy Heart: regular rate and rhythm Abdomen: soft, non-tender; no masses, no organomegaly Extremities: extremities normal, atraumatic, no cyanosis or edema Skin: skin color, texture, turgor normal. No rashes or lesions Lymph nodes: cervical, supraclavicular, and axillary nodes normal. Neurologic: grossly normal  Pelvic: External genitalia:  no lesions              No abnormal inguinal nodes palpated.              Urethra:  normal appearing urethra with no masses, tenderness or lesions              Bartholins and Skenes: normal                  Vagina: normal appearing vagina with normal color and discharge, no lesions              Cervix: no lesions              Pap taken: yes Bimanual Exam:  Uterus:  normal size, contour, position, consistency, mobility, non-tender              Adnexa: no mass, fullness, tenderness              Rectal exam:  no masses.   Chaperone was present for exam:  Santiago Glad, CMA  Assessment:   Hx LGSIL and positive HR HPV. HRT patient. Status post endometrial ablation.  FH CAD. Depression and stress.  Plan: Mammogram screening discussed. Self breast awareness reviewed. Pap and HR HPV collected. Guidelines for Calcium, Vitamin D, regular exercise program including cardiovascular and weight bearing exercise. Refill of HRT for one year.  Estrogen patch twice weekly and Prometrium daily at hs.  Discused WHI and use of HRT which can increase risk of PE, DVT, MI, stroke and breast cancer.  We discussed Effexor and counseling to treat her stress.  She will consider these options.  We reviewed Effexor as a way to treat menopausal vasomotor symptoms also.  Follow up annually and prn.   After visit summary provided.   35 min  total time was spent for this patient encounter, including preparation, face-to-face counseling with the patient, coordination of care, and documentation of the encounter.

## 2021-08-10 ENCOUNTER — Encounter: Payer: Self-pay | Admitting: Obstetrics and Gynecology

## 2021-08-10 ENCOUNTER — Other Ambulatory Visit (HOSPITAL_COMMUNITY)
Admission: RE | Admit: 2021-08-10 | Discharge: 2021-08-10 | Disposition: A | Discharge status: Home / Self Care | Payer: PPO | Source: Ambulatory Visit | Attending: Obstetrics and Gynecology | Admitting: Obstetrics and Gynecology

## 2021-08-10 ENCOUNTER — Ambulatory Visit (INDEPENDENT_AMBULATORY_CARE_PROVIDER_SITE_OTHER): Payer: PPO | Admitting: Obstetrics and Gynecology

## 2021-08-10 VITALS — BP 130/80 | HR 60 | Ht 64.0 in | Wt 147.0 lb

## 2021-08-10 DIAGNOSIS — Z01419 Encounter for gynecological examination (general) (routine) without abnormal findings: Secondary | ICD-10-CM | POA: Diagnosis not present

## 2021-08-10 DIAGNOSIS — Z79890 Hormone replacement therapy (postmenopausal): Secondary | ICD-10-CM | POA: Diagnosis not present

## 2021-08-10 DIAGNOSIS — Z1151 Encounter for screening for human papillomavirus (HPV): Secondary | ICD-10-CM | POA: Diagnosis not present

## 2021-08-10 DIAGNOSIS — Z124 Encounter for screening for malignant neoplasm of cervix: Secondary | ICD-10-CM | POA: Insufficient documentation

## 2021-08-10 DIAGNOSIS — F439 Reaction to severe stress, unspecified: Secondary | ICD-10-CM

## 2021-08-10 DIAGNOSIS — Z8619 Personal history of other infectious and parasitic diseases: Secondary | ICD-10-CM

## 2021-08-10 MED ORDER — PROGESTERONE MICRONIZED 100 MG PO CAPS
ORAL_CAPSULE | ORAL | 3 refills | Status: DC
Start: 2021-08-10 — End: 2022-08-14

## 2021-08-10 MED ORDER — ESTRADIOL 0.0375 MG/24HR TD PTTW: MEDICATED_PATCH | TRANSDERMAL | 3 refills | Status: DC

## 2021-08-10 NOTE — Patient Instructions (Signed)

## 2021-08-15 LAB — CYTOLOGY - PAP
Comment: NEGATIVE
Diagnosis: NEGATIVE
High risk HPV: NEGATIVE

## 2021-08-17 ENCOUNTER — Other Ambulatory Visit: Payer: Self-pay | Admitting: Obstetrics and Gynecology

## 2021-08-17 DIAGNOSIS — Z1231 Encounter for screening mammogram for malignant neoplasm of breast: Secondary | ICD-10-CM

## 2021-09-19 ENCOUNTER — Ambulatory Visit
Admission: RE | Admit: 2021-09-19 | Discharge: 2021-09-19 | Disposition: A | Discharge status: Home / Self Care | Payer: PPO | Source: Ambulatory Visit | Attending: Obstetrics and Gynecology | Admitting: Obstetrics and Gynecology

## 2021-09-19 DIAGNOSIS — Z1231 Encounter for screening mammogram for malignant neoplasm of breast: Secondary | ICD-10-CM

## 2021-10-07 ENCOUNTER — Ambulatory Visit: Payer: PPO

## 2021-10-13 ENCOUNTER — Ambulatory Visit: Payer: PPO

## 2021-11-08 ENCOUNTER — Ambulatory Visit
Admission: RE | Admit: 2021-11-08 | Discharge: 2021-11-08 | Disposition: A | Discharge status: Home / Self Care | Payer: PPO | Source: Ambulatory Visit | Attending: Obstetrics and Gynecology | Admitting: Obstetrics and Gynecology

## 2021-11-08 DIAGNOSIS — Z1231 Encounter for screening mammogram for malignant neoplasm of breast: Secondary | ICD-10-CM | POA: Diagnosis not present

## 2021-11-11 ENCOUNTER — Other Ambulatory Visit: Payer: Self-pay | Admitting: Obstetrics and Gynecology

## 2021-11-11 DIAGNOSIS — R928 Other abnormal and inconclusive findings on diagnostic imaging of breast: Secondary | ICD-10-CM

## 2021-11-24 ENCOUNTER — Ambulatory Visit
Admission: RE | Admit: 2021-11-24 | Discharge: 2021-11-24 | Disposition: A | Discharge status: Home / Self Care | Payer: PPO | Source: Ambulatory Visit | Attending: Obstetrics and Gynecology | Admitting: Obstetrics and Gynecology

## 2021-11-24 DIAGNOSIS — R928 Other abnormal and inconclusive findings on diagnostic imaging of breast: Secondary | ICD-10-CM

## 2021-11-24 DIAGNOSIS — N6489 Other specified disorders of breast: Secondary | ICD-10-CM | POA: Diagnosis not present

## 2021-11-24 DIAGNOSIS — R92321 Mammographic fibroglandular density, right breast: Secondary | ICD-10-CM | POA: Diagnosis not present

## 2022-02-12 ENCOUNTER — Other Ambulatory Visit: Payer: Self-pay | Admitting: Obstetrics and Gynecology

## 2022-02-14 NOTE — Telephone Encounter (Signed)
Annual visit 08/10/21.  MMG and diag breast u/s negative 11/25/21.  Upon review of chart this Rx was sent in for the year at annual visit. I spoke with pharmacy and confirmed that they do have that Rx on file and will get a refill ready for her.  Pharmacy suggested that perhaps she requested refill on an old Rx #. We will deny this refill today since she has Rx w refills on file.

## 2022-03-13 DIAGNOSIS — F411 Generalized anxiety disorder: Secondary | ICD-10-CM | POA: Diagnosis not present

## 2022-03-13 DIAGNOSIS — F330 Major depressive disorder, recurrent, mild: Secondary | ICD-10-CM | POA: Diagnosis not present

## 2022-04-22 IMAGING — MG MM DIGITAL SCREENING BILAT W/ TOMO AND CAD
8 series · 9 of 24 positions shown · non-contrast
Comparison: Previous exam(s).

CLINICAL DATA: Screening.

EXAM:
DIGITAL SCREENING BILATERAL MAMMOGRAM WITH TOMOSYNTHESIS AND CAD
TECHNIQUE: Bilateral screening digital craniocaudal and mediolateral oblique
mammograms were obtained. Bilateral screening digital breast
tomosynthesis was performed. The images were evaluated with
computer-aided detection.

[L MLO synth-2D]
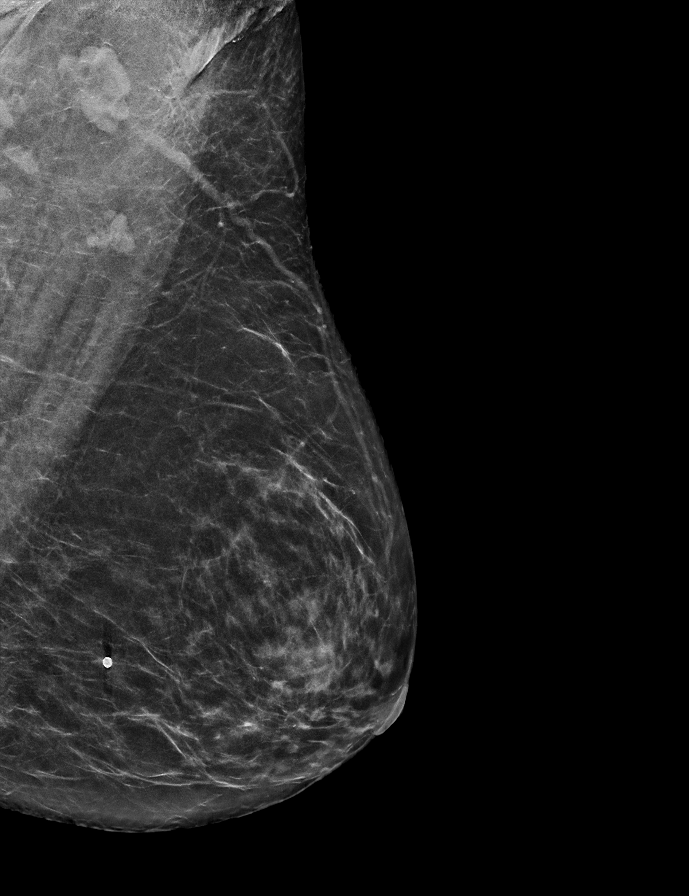

[R CC synth-2D]
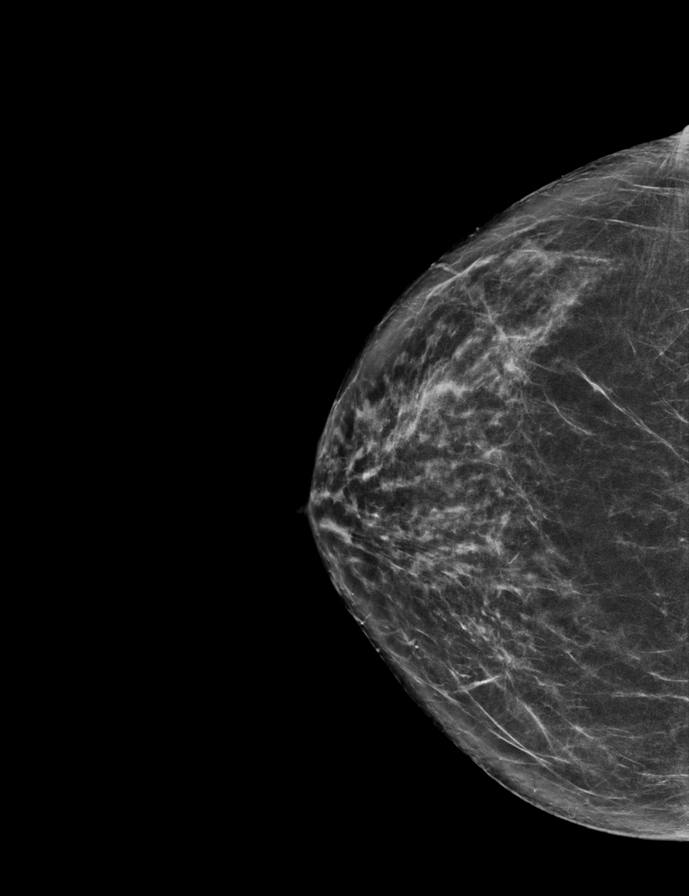

[R MLO synth-2D]
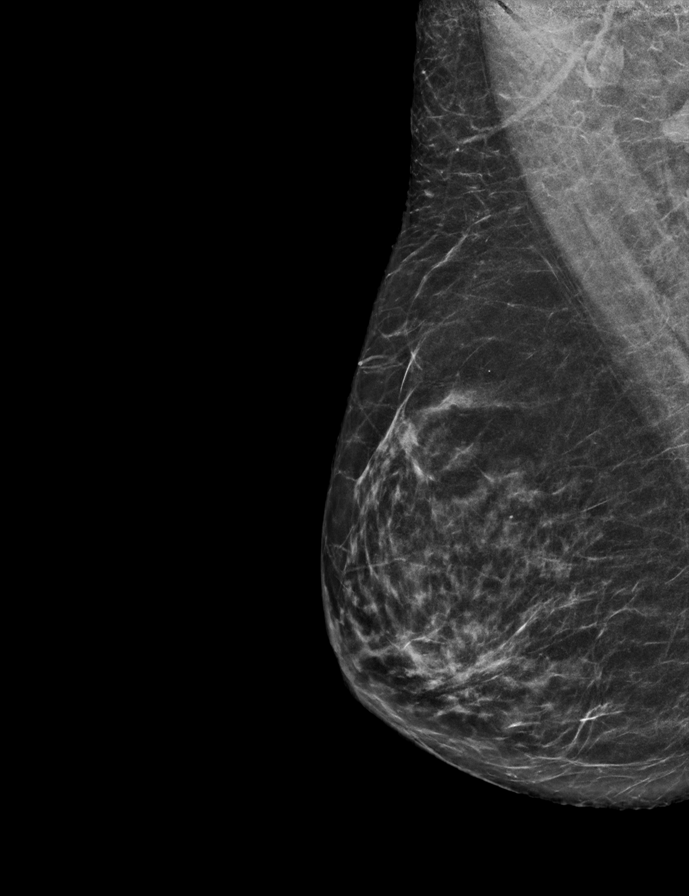

[L CC synth-2D]
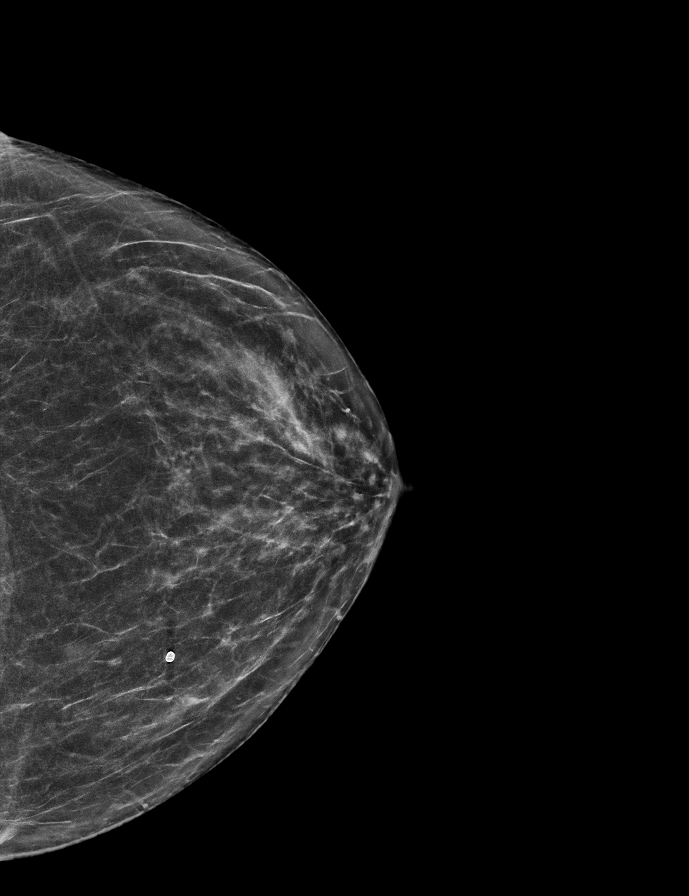

[R MLO tomo · 2 of 61 frames shown]
[frame 20/61]
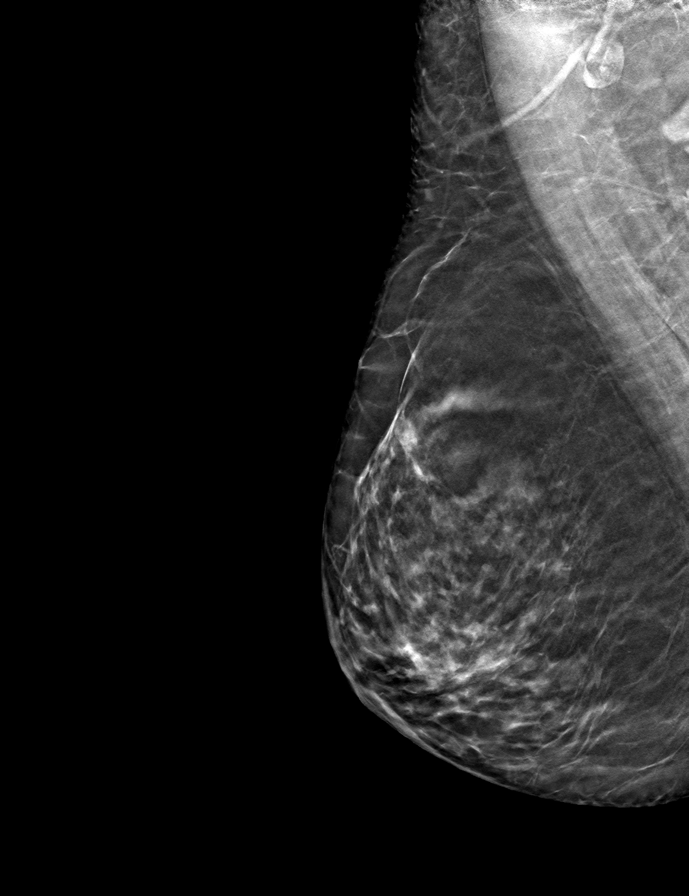
[frame 31/61]
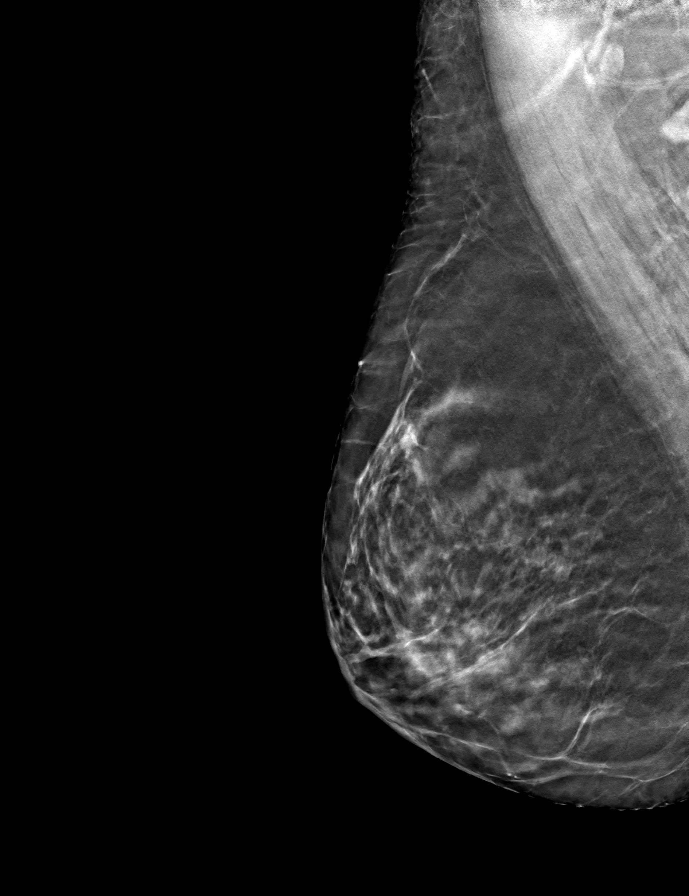

[R CC tomo · tomo slice 32/63.0]
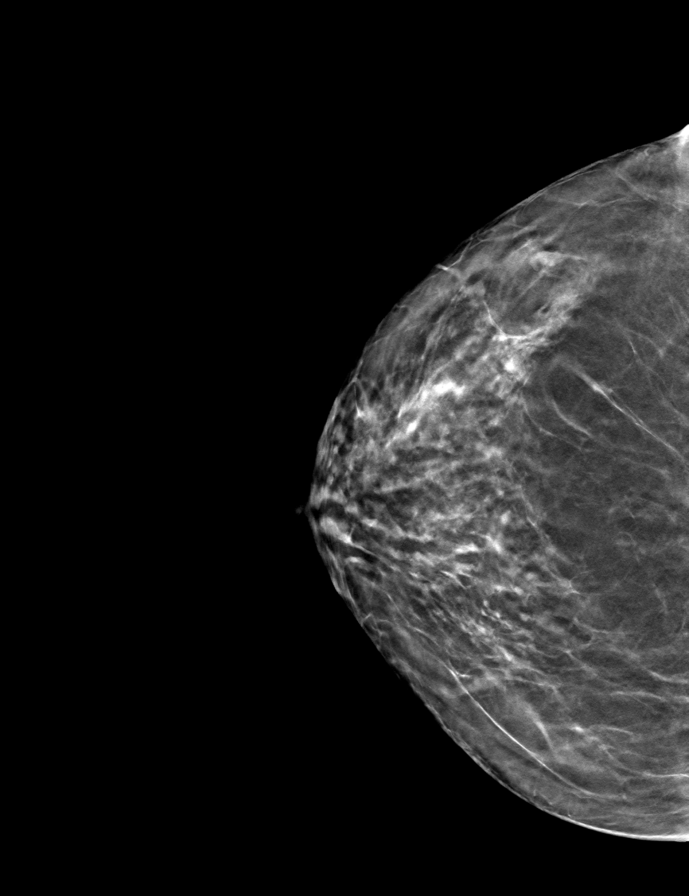

[L MLO tomo · tomo slice 31/62.0]
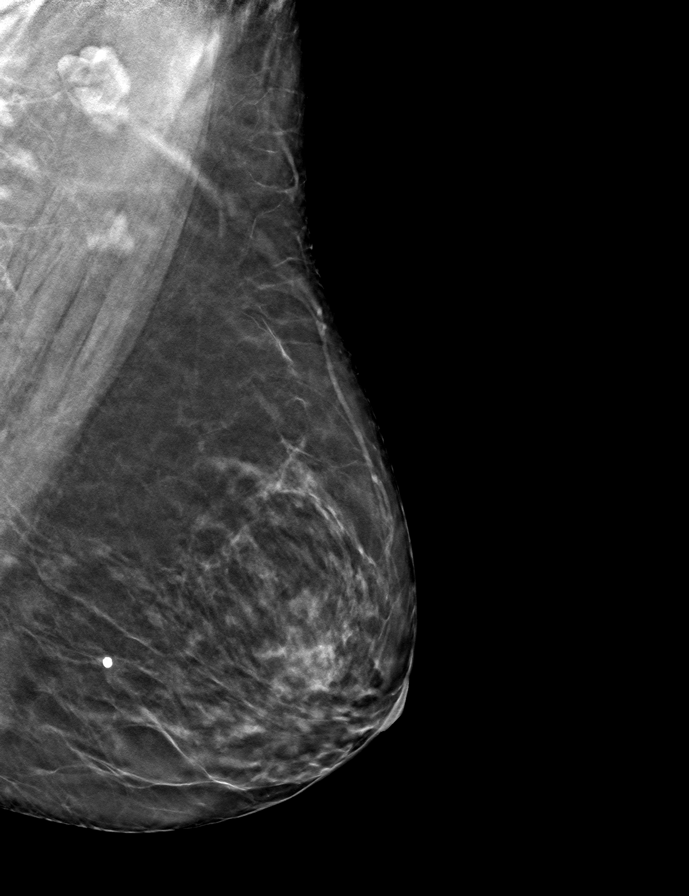

[L CC tomo · tomo slice 29/58.0]
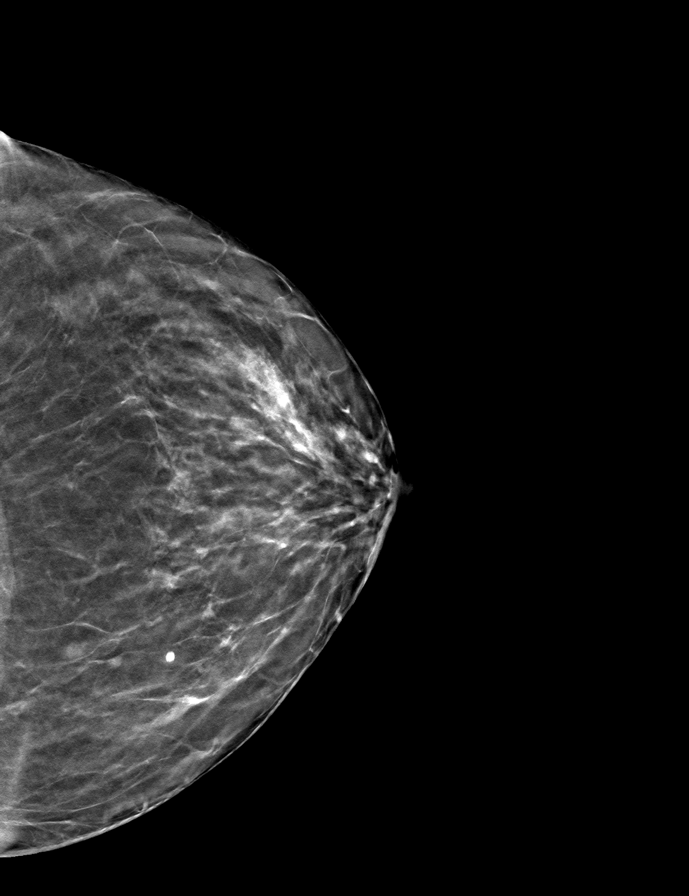

[9 of 24 positions shown; findings below may reference images not displayed]

ACR Breast Density Category b: There are scattered areas of
fibroglandular density.
FINDINGS: There are no findings suspicious for malignancy.
IMPRESSION: No mammographic evidence of malignancy. A result letter of this
screening mammogram will be mailed directly to the patient.

RECOMMENDATION:
Screening mammogram in one year. (Code:51-O-LD2)

BI-RADS CATEGORY  1: Negative.

## 2022-05-25 DIAGNOSIS — S40861A Insect bite (nonvenomous) of right upper arm, initial encounter: Secondary | ICD-10-CM | POA: Diagnosis not present

## 2022-05-25 DIAGNOSIS — T63441A Toxic effect of venom of bees, accidental (unintentional), initial encounter: Secondary | ICD-10-CM | POA: Diagnosis not present

## 2022-08-08 NOTE — Progress Notes (Signed)
67 y.o. G2P2 Married Caucasian female here for breast and pelvic exam and follow up of HRT.    On HRT for at least 10 years.  Wants to continue.   Mother passed away last year.  Patient is now having less stress and is less tired since she stopped Effexor.  Feels better off Effexor.   PCP:   Dr. Wynelle Link  Patient's last menstrual period was 01/24/2003 (approximate).           Sexually active: Yes.    The current method of family planning is post menopausal status.    Exercising: Yes.     Gym, running Smoker:  no  Health Maintenance: Pap:  08/10/21 neg: HR HPV neg, 08/04/20 neg: HR HPV neg History of abnormal Pap:  yes, Hx of cryotherapy in her 40's  MMG:  11/24/21 Breast Density Cat B, BI-RADS CAT 2 benign Colonoscopy:  01/2021 normal - due in 10 years per patient.  BMD:   09/16/20  Result  normal TDaP:  06/21/18 Gardasil:   no HIV: 04/12/16 NR Hep C: 04/12/16 Neg Screening Labs:  HPCP   reports that she has never smoked. She has never used smokeless tobacco. She reports current alcohol use of about 4.0 standard drinks of alcohol per week. She reports that she does not use drugs.  Past Medical History:  Diagnosis Date   Abnormal Pap smear of cervix 1999   Cryo   Amenorrhea    Cancer [facility]) 2009   Sq cell cancer Left face   GERD (gastroesophageal reflux disease)    H/O gastric ulcer    Menorrhagia 3/05   MVP (mitral valve prolapse)    Vitamin D deficiency 03/2016    Past Surgical History:  Procedure Laterality Date   ABLATION  06/15/03   HTA     Birth Mark Removed     foot   COLPOSCOPY  01/2009   CIN 1   ENDOMETRIAL BIOPSY  03/2003   benign   GYNECOLOGIC CRYOSURGERY  1999   TUBAL LIGATION     BTL    Current Outpatient Medications  Medication Sig Dispense Refill   Cholecalciferol (VITAMIN D3) 1000 units CAPS Take 1 capsule by mouth daily.     cycloSPORINE (RESTASIS) 0.05 % ophthalmic emulsion 1 drop 2 (two) times daily.     DEXILANT 60 MG capsule Take 1 tablet by mouth  daily. Patient takes qod  11   estradiol (VIVELLE-DOT) 0.0375 MG/24HR APPLY 1 PATCH EXTERNALLY TO THE SKIN 2 TIMES A WEEK 24 patch 3   Multiple Vitamin (MULTIVITAMIN ADULT PO) Take by mouth.     progesterone (PROMETRIUM) 100 MG capsule TAKE 1 CAPSULE(100 MG) BY MOUTH DAILY 90 capsule 3   tetracycline (SUMYCIN) 250 MG capsule Take 250 mg by mouth daily.     No current facility-administered medications for this visit.    Family History  Problem Relation Age of Onset   Hypertension Mother    Anemia Mother    Heart disease Mother    Heart disease Father    Cancer Brother        Doree Fudge  4 Sarcoma--Dec age 40   Breast cancer Maternal Aunt     Review of Systems  All other systems reviewed and are negative.   Exam:   BP 122/82 (BP Location: Left Arm, Patient Position: Sitting, Cuff Size: Normal)   Pulse 72   Ht 5' 4.5" (1.638 m)   Wt 145 lb (65.8 kg)   LMP 01/24/2003 (Approximate)  SpO2 98%   BMI 24.50 kg/m     General appearance: alert, cooperative and appears stated age Head: normocephalic, without obvious abnormality, atraumatic Neck: no adenopathy, supple, symmetrical, trachea midline and thyroid normal to inspection and palpation Lungs: clear to auscultation bilaterally Breasts: normal appearance, no masses or tenderness, No nipple retraction or dimpling, No nipple discharge or bleeding, No axillary adenopathy Heart: regular rate and rhythm Abdomen: soft, non-tender; no masses, no organomegaly Extremities: extremities normal, atraumatic, no cyanosis or edema Skin: skin color, texture, turgor normal. No rashes or lesions Lymph nodes: cervical, supraclavicular, and axillary nodes normal. Neurologic: grossly normal  Pelvic: External genitalia:  no lesions              No abnormal inguinal nodes palpated.              Urethra:  normal appearing urethra with no masses, tenderness or lesions              Bartholins and Skenes: normal                 Vagina: normal appearing  vagina with normal color and discharge, no lesions              Cervix: no lesions              Pap taken: no Bimanual Exam:  Uterus:  normal size, contour, position, consistency, mobility, non-tender              Adnexa: no mass, fullness, tenderness              Rectal exam: yes.  Confirms.              Anus:  normal sphincter tone, no lesions  Chaperone was present for exam:  Warren Lacy, CMA  Assessment:   Well woman visit with gynecologic exam. Hx LGSIL and positive HR HPV. HRT patient. Status post endometrial ablation.  FH CAD.   Plan: Mammogram screening discussed. Self breast awareness reviewed. Pap and HR HPV 2028. Guidelines for Calcium, Vitamin D, regular exercise program including cardiovascular and weight bearing exercise. Discused WHI and use of HRT which can increase risk of PE, DVT, MI, stroke and breast cancer.  Rx for Estradiol transdermal patch 0.0375 mg twice weekly, #24, RF 3 and Prometrium 100 mg q hs, #90, RF 3.   Follow up annually and prn.   20 min  total time was spent for this patient encounter, including preparation, face-to-face counseling with the patient, coordination of care, and documentation of the encounter regarding HRT in addition to doing breast and pelvic exam.

## 2022-08-14 ENCOUNTER — Encounter: Payer: Self-pay | Admitting: Obstetrics and Gynecology

## 2022-08-14 ENCOUNTER — Ambulatory Visit: Payer: PPO | Admitting: Obstetrics and Gynecology

## 2022-08-14 VITALS — BP 122/82 | HR 72 | Ht 64.5 in | Wt 145.0 lb

## 2022-08-14 DIAGNOSIS — Z79890 Hormone replacement therapy (postmenopausal): Secondary | ICD-10-CM | POA: Diagnosis not present

## 2022-08-14 DIAGNOSIS — R87610 Atypical squamous cells of undetermined significance on cytologic smear of cervix (ASC-US): Secondary | ICD-10-CM | POA: Diagnosis not present

## 2022-08-14 DIAGNOSIS — Z9189 Other specified personal risk factors, not elsewhere classified: Secondary | ICD-10-CM

## 2022-08-14 DIAGNOSIS — Z01419 Encounter for gynecological examination (general) (routine) without abnormal findings: Secondary | ICD-10-CM

## 2022-08-14 MED ORDER — ESTRADIOL 0.0375 MG/24HR TD PTTW: MEDICATED_PATCH | TRANSDERMAL | 3 refills | Status: DC

## 2022-08-14 MED ORDER — PROGESTERONE MICRONIZED 100 MG PO CAPS: ORAL_CAPSULE | ORAL | 3 refills | Status: DC

## 2022-08-14 NOTE — Patient Instructions (Signed)

## 2022-08-15 DIAGNOSIS — Z0000 Encounter for general adult medical examination without abnormal findings: Secondary | ICD-10-CM | POA: Diagnosis not present

## 2022-08-15 DIAGNOSIS — F3340 Major depressive disorder, recurrent, in remission, unspecified: Secondary | ICD-10-CM | POA: Diagnosis not present

## 2022-08-15 DIAGNOSIS — I10 Essential (primary) hypertension: Secondary | ICD-10-CM | POA: Diagnosis not present

## 2022-08-15 DIAGNOSIS — L719 Rosacea, unspecified: Secondary | ICD-10-CM | POA: Diagnosis not present

## 2022-08-15 DIAGNOSIS — Z23 Encounter for immunization: Secondary | ICD-10-CM | POA: Diagnosis not present

## 2022-08-15 DIAGNOSIS — K219 Gastro-esophageal reflux disease without esophagitis: Secondary | ICD-10-CM | POA: Diagnosis not present

## 2022-08-15 DIAGNOSIS — K2270 Barrett's esophagus without dysplasia: Secondary | ICD-10-CM | POA: Diagnosis not present

## 2022-08-15 DIAGNOSIS — F411 Generalized anxiety disorder: Secondary | ICD-10-CM | POA: Diagnosis not present

## 2022-08-22 DIAGNOSIS — L578 Other skin changes due to chronic exposure to nonionizing radiation: Secondary | ICD-10-CM | POA: Diagnosis not present

## 2022-08-22 DIAGNOSIS — L821 Other seborrheic keratosis: Secondary | ICD-10-CM | POA: Diagnosis not present

## 2022-08-22 DIAGNOSIS — L720 Epidermal cyst: Secondary | ICD-10-CM | POA: Diagnosis not present

## 2022-08-22 DIAGNOSIS — Z85828 Personal history of other malignant neoplasm of skin: Secondary | ICD-10-CM | POA: Diagnosis not present

## 2022-08-22 DIAGNOSIS — L570 Actinic keratosis: Secondary | ICD-10-CM | POA: Diagnosis not present

## 2022-08-22 DIAGNOSIS — D485 Neoplasm of uncertain behavior of skin: Secondary | ICD-10-CM | POA: Diagnosis not present

## 2022-08-22 DIAGNOSIS — D2239 Melanocytic nevi of other parts of face: Secondary | ICD-10-CM | POA: Diagnosis not present

## 2022-08-22 DIAGNOSIS — D2230 Melanocytic nevi of unspecified part of face: Secondary | ICD-10-CM | POA: Diagnosis not present

## 2022-08-22 DIAGNOSIS — D225 Melanocytic nevi of trunk: Secondary | ICD-10-CM | POA: Diagnosis not present

## 2022-08-22 DIAGNOSIS — Z808 Family history of malignant neoplasm of other organs or systems: Secondary | ICD-10-CM | POA: Diagnosis not present

## 2022-10-09 ENCOUNTER — Other Ambulatory Visit: Payer: Self-pay | Admitting: Obstetrics and Gynecology

## 2022-10-09 DIAGNOSIS — Z0000 Encounter for general adult medical examination without abnormal findings: Secondary | ICD-10-CM

## 2022-11-23 DIAGNOSIS — D485 Neoplasm of uncertain behavior of skin: Secondary | ICD-10-CM | POA: Diagnosis not present

## 2022-11-23 DIAGNOSIS — L570 Actinic keratosis: Secondary | ICD-10-CM | POA: Diagnosis not present

## 2022-11-27 ENCOUNTER — Ambulatory Visit
Admission: RE | Admit: 2022-11-27 | Discharge: 2022-11-27 | Disposition: A | Discharge status: Home / Self Care | Payer: PPO | Source: Ambulatory Visit | Attending: Obstetrics and Gynecology | Admitting: Obstetrics and Gynecology

## 2022-11-27 DIAGNOSIS — Z1231 Encounter for screening mammogram for malignant neoplasm of breast: Secondary | ICD-10-CM | POA: Diagnosis not present

## 2022-11-27 DIAGNOSIS — Z0000 Encounter for general adult medical examination without abnormal findings: Secondary | ICD-10-CM

## 2022-12-15 DIAGNOSIS — Z860101 Personal history of adenomatous and serrated colon polyps: Secondary | ICD-10-CM | POA: Diagnosis not present

## 2022-12-15 DIAGNOSIS — K219 Gastro-esophageal reflux disease without esophagitis: Secondary | ICD-10-CM | POA: Diagnosis not present

## 2023-05-03 ENCOUNTER — Encounter: Payer: Self-pay | Admitting: Nurse Practitioner

## 2023-05-03 ENCOUNTER — Ambulatory Visit: Admitting: Nurse Practitioner

## 2023-05-03 VITALS — BP 128/72 | HR 78

## 2023-05-03 DIAGNOSIS — R300 Dysuria: Secondary | ICD-10-CM | POA: Diagnosis not present

## 2023-05-03 DIAGNOSIS — N3000 Acute cystitis without hematuria: Secondary | ICD-10-CM

## 2023-05-03 MED ORDER — NITROFURANTOIN MONOHYD MACRO 100 MG PO CAPS: 100.0000 mg | ORAL_CAPSULE | Freq: Two times a day (BID) | ORAL | 0 refills | Status: DC

## 2023-05-03 NOTE — Progress Notes (Signed)
   Acute Office Visit  Subjective:    Patient ID: Misty Odom, female    DOB: Nov 11, 1955, 68 y.o.   MRN: [id number]   HPI 68 y.o. presents today for dysuria, frequency and pelvic pressure x 2 weeks. Increased water intake and drank cranberry juice with no improvement.   Patient's last menstrual period was 01/24/2003 (approximate).    Review of Systems  Constitutional: Negative.  Negative for chills and fever.  Genitourinary:  Positive for dysuria, frequency, pelvic pain and urgency. Negative for flank pain and hematuria.       Objective:    Physical Exam Constitutional:      Appearance: Normal appearance.   GU: Not indicated  BP 128/72 (BP Location: Right Arm, Patient Position: Sitting, Cuff Size: Small)   Pulse 78   LMP 01/24/2003 (Approximate)   SpO2 99%  Wt Readings from Last 3 Encounters:  08/14/22 145 lb (65.8 kg)  08/10/21 147 lb (66.7 kg)  08/04/20 144 lb (65.3 kg)        UA: 1+ leukocytes, neg nitrites, 1+ blood, yellow/cloudy. Microscopic: wbc 20-40, rbc 3-10, moderate bacteria   Assessment & Plan:   Problem List Items Addressed This Visit   None Visit Diagnoses       Acute cystitis without hematuria    -  Primary   Relevant Medications   nitrofurantoin, macrocrystal-monohydrate, (MACROBID) 100 MG capsule     Dysuria       Relevant Orders   Urinalysis,Complete w/RFL Culture      Plan: Acute cystitis - Macrobid 100 mg BID x 7 days. Culture pending. Declines pyridium.   Return if symptoms worsen or fail to improve.    Olivia Mackie DNP, 2:15 PM 05/03/2023

## 2023-05-05 ENCOUNTER — Encounter: Payer: Self-pay | Admitting: Obstetrics and Gynecology

## 2023-05-05 LAB — URINALYSIS, COMPLETE W/RFL CULTURE
Bilirubin Urine: NEGATIVE
Glucose, UA: NEGATIVE
Hyaline Cast: NONE SEEN /LPF
Ketones, ur: NEGATIVE
Nitrites, Initial: NEGATIVE
Protein, ur: NEGATIVE
Specific Gravity, Urine: 1.004 (ref 1.001–1.035)
pH: 5.5 (ref 5.0–8.0)

## 2023-05-05 LAB — URINE CULTURE
MICRO NUMBER:: 16313732
Result:: NO GROWTH
SPECIMEN QUALITY:: ADEQUATE

## 2023-05-05 LAB — CULTURE INDICATED

## 2023-05-05 MED ORDER — PHENAZOPYRIDINE HCL 200 MG PO TABS: 200.0000 mg | ORAL_TABLET | Freq: Three times a day (TID) | ORAL | 1 refills | Status: AC | PRN

## 2023-05-05 NOTE — Progress Notes (Signed)
 Returned call from answering service, confirmed ID x2. Patient reports having urinary frequency and pelvic pressure and diagnosed with UTI in office and started on macrobid. Had been taking macrobid for 48 hours without improvement and experiencing significant pelvic pressure and wondering if something else can be taken such as cipro which has worked previously. Also noted having had symptoms for several days prior to visit and attempted treatment with increased water intake. Had not taken any antibiotics prior to presentation and no recent change in medication.  Noted that urine culture was negative though UA concerning for possible infection. Denies systemic symptoms of infection and no other vaginal symptoms. Recommend trial of pyridium, sent in to preferred pharmacy

## 2023-05-07 ENCOUNTER — Encounter: Payer: Self-pay | Admitting: Obstetrics and Gynecology

## 2023-05-07 ENCOUNTER — Ambulatory Visit (INDEPENDENT_AMBULATORY_CARE_PROVIDER_SITE_OTHER): Admitting: Obstetrics and Gynecology

## 2023-05-07 VITALS — BP 124/74 | HR 80 | Ht 64.5 in | Wt 145.0 lb

## 2023-05-07 DIAGNOSIS — N390 Urinary tract infection, site not specified: Secondary | ICD-10-CM | POA: Diagnosis not present

## 2023-05-07 DIAGNOSIS — R300 Dysuria: Secondary | ICD-10-CM

## 2023-05-07 DIAGNOSIS — R102 Pelvic and perineal pain: Secondary | ICD-10-CM | POA: Diagnosis not present

## 2023-05-07 LAB — WET PREP FOR TRICH, YEAST, CLUE

## 2023-05-07 MED ORDER — IBUPROFEN 800 MG PO TABS
800.0000 mg | ORAL_TABLET | Freq: Three times a day (TID) | ORAL | 0 refills | Status: AC | PRN
Start: 2023-05-07 — End: ?

## 2023-05-07 MED ORDER — SULFAMETHOXAZOLE-TRIMETHOPRIM 800-160 MG PO TABS: 1.0000 | ORAL_TABLET | Freq: Two times a day (BID) | ORAL | 0 refills | Status: AC

## 2023-05-07 NOTE — Progress Notes (Signed)
 GYNECOLOGY  VISIT   HPI: 68 y.o.   Married  Caucasian female   G2P2 with Patient's last menstrual period was 01/24/2003 (approximate).   here for: UTI not clearing up. Pt now reports constant burning, dysuria and urinary discoloration (bright orange).    Symptoms started 1.5 weeks ago before having sex.  Sex increased the discomfort.  No partner change.  Has pressure with urination and dysuria.   Started on Macrobid which did not help. Pyridium started during the weekend and not helpful.  UC negative from 05/03/23.   Has constant burning in the pelvic area points to pubic region.    Heat improves her discomfort.   No discharge.    On HRT. No known bleeding.   Using new soap.    Takes Doxycycline as needed for rosacea.  Not currently using this.  GYNECOLOGIC HISTORY: Patient's last menstrual period was 01/24/2003 (approximate). Contraception:  PMP Menopausal hormone therapy:  vivelle-dot, progesterone Last 2 paps:  08/10/21 neg: HR HPV neg, 08/04/20 neg: HR HPV neg History of abnormal Pap or positive HPV:  no Mammogram:  11/27/22 Breast Density Cat B, BI-RADS CAT 1 neg        OB History     Gravida  2   Para  2   Term      Preterm      AB      Living  2      SAB      IAB      Ectopic      Multiple      Live Births                 There are no active problems to display for this patient.   Past Medical History:  Diagnosis Date   Abnormal Pap smear of cervix 1999   Cryo   Amenorrhea    Cancer [facility]) 2009   Sq cell cancer Left face   GERD (gastroesophageal reflux disease)    H/O gastric ulcer    Menorrhagia 3/05   MVP (mitral valve prolapse)    Vitamin D deficiency 03/2016    Past Surgical History:  Procedure Laterality Date   ABLATION  06/15/03   HTA     Birth Mark Removed     foot   COLPOSCOPY  01/2009   CIN 1   ENDOMETRIAL BIOPSY  03/2003   benign   GYNECOLOGIC CRYOSURGERY  1999   TUBAL LIGATION     BTL    Current  Outpatient Medications  Medication Sig Dispense Refill   Cholecalciferol (VITAMIN D3) 1000 units CAPS Take 1 capsule by mouth daily.     cycloSPORINE (RESTASIS) 0.05 % ophthalmic emulsion 1 drop 2 (two) times daily.     DEXILANT 60 MG capsule Take 1 tablet by mouth daily. Patient takes qod  11   estradiol (VIVELLE-DOT) 0.0375 MG/24HR APPLY 1 PATCH EXTERNALLY TO THE SKIN 2 TIMES A WEEK 24 patch 3   Multiple Vitamin (MULTIVITAMIN ADULT PO) Take by mouth.     nitrofurantoin, macrocrystal-monohydrate, (MACROBID) 100 MG capsule Take 1 capsule (100 mg total) by mouth 2 (two) times daily. 14 capsule 0   phenazopyridine (PYRIDIUM) 200 MG tablet Take 1 tablet (200 mg total) by mouth 3 (three) times daily as needed for pain (urethral spasm). 10 tablet 1   progesterone (PROMETRIUM) 100 MG capsule TAKE 1 CAPSULE(100 MG) BY MOUTH DAILY 90 capsule 3   tetracycline (SUMYCIN) 250 MG capsule Take 250 mg by mouth  daily.     No current facility-administered medications for this visit.     ALLERGIES: Patient has no known allergies.  Family History  Problem Relation Age of Onset   Hypertension Mother    Anemia Mother    Heart disease Mother    Heart disease Father    Cancer Brother        Sage  4 Sarcoma--Dec age 17   Breast cancer Maternal Aunt     Social History   Socioeconomic History   Marital status: Married    Spouse name: Not on file   Number of children: Not on file   Years of education: Not on file   Highest education level: Not on file  Occupational History   Not on file  Tobacco Use   Smoking status: Never   Smokeless tobacco: Never  Vaping Use   Vaping status: Never Used  Substance and Sexual Activity   Alcohol use: Yes    Alcohol/week: 4.0 standard drinks of alcohol    Types: 4 Standard drinks or equivalent per week    Comment: 1-2 drinks a week (alcohol or wine)   Drug use: No   Sexual activity: Yes    Partners: Male    Birth control/protection: Post-menopausal, Surgical     Comment: BTL, less than 5, after 16 IC, no STD, no abnormal pap  Other Topics Concern   Not on file  Social History Narrative   Not on file   Social Drivers of Health   Financial Resource Strain: Not on file  Food Insecurity: Not on file  Transportation Needs: Not on file  Physical Activity: Not on file  Stress: Not on file  Social Connections: Not on file  Intimate Partner Violence: Not on file    Review of Systems  All other systems reviewed and are negative.   PHYSICAL EXAMINATION:   BP 124/74 (BP Location: Right Arm, Patient Position: Sitting, Cuff Size: Small)   Pulse 80   Ht 5' 4.5" (1.638 m)   Wt 145 lb (65.8 kg)   LMP 01/24/2003 (Approximate)   SpO2 97%   BMI 24.50 kg/m     General appearance: alert, cooperative and appears stated age  Pelvic: External genitalia:  no lesions              Urethra:  normal appearing urethra with no masses, tenderness or lesions              Bartholins and Skenes: normal                 Vagina: normal appearing vagina with normal color and discharge, no lesions              Cervix: no lesions                Bimanual Exam:  Uterus:  normal size, contour, position, consistency, mobility, non-tender              Adnexa: no mass, fullness, tenderness        Chaperone was present for exam:  Warren Lacy, CMA  ASSESSMENT:  Dysuria.    Pelvic pain.   PLAN:  Urinalysis:  sg 1.004, ph 5.5, positive nitrites, 0 - 5 WBC, 0 - 2 RBC, few bacteria.  UC sent.  Wet prep: negative.  Stop Macrobid.  Start Bactrim DS po bid x 3 days.   Motrin 800 mg po q 8 hours prn.  Pelvic US.  Call for worsening pain.  30 min  total time was spent for this patient encounter, including preparation, face-to-face counseling with the patient, coordination of care, and documentation of the encounter.

## 2023-05-08 LAB — URINE CULTURE
MICRO NUMBER:: 16325580
Result:: NO GROWTH
SPECIMEN QUALITY:: ADEQUATE

## 2023-05-08 LAB — URINALYSIS, COMPLETE W/RFL CULTURE
Bilirubin Urine: NEGATIVE
Hgb urine dipstick: NEGATIVE
Hyaline Cast: NONE SEEN /LPF
Ketones, ur: NEGATIVE
Leukocyte Esterase: NEGATIVE
Nitrites, Initial: POSITIVE — AB
Specific Gravity, Urine: 1.004 (ref 1.001–1.035)
pH: 5.5 (ref 5.0–8.0)

## 2023-05-08 LAB — CULTURE INDICATED

## 2023-05-09 ENCOUNTER — Telehealth: Payer: Self-pay

## 2023-05-09 NOTE — Telephone Encounter (Signed)
 Patient called and stated she has stopped the Bactrim DS. She says that she feels like it did help her even through her urine showed no infection. Patient requested this be added to her chart.

## 2023-05-14 ENCOUNTER — Encounter: Payer: Self-pay | Admitting: Obstetrics and Gynecology

## 2023-05-15 MED ORDER — CIPROFLOXACIN HCL 500 MG PO TABS: 500.0000 mg | ORAL_TABLET | Freq: Two times a day (BID) | ORAL | 0 refills | Status: AC

## 2023-05-15 NOTE — Telephone Encounter (Signed)
Routing to Dr. Silva to review and advise.  

## 2023-05-15 NOTE — Telephone Encounter (Signed)
 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 She says she unfortunately can't come in because if she does that she will lose her position. She says that's why she came in twice last week for this problem because she knew once the market started that she would be unavailable. Patient states the only thing that always helps her is the cipro . She uses the [facility] on [address]. Please advise.

## 2023-05-15 NOTE — Telephone Encounter (Signed)
 Patient notified that Cipro  has been sent to the pharmacy. I went over the side effects that Dr Colvin Dec listed & patient said she understands & accepts the risk.

## 2023-05-24 ENCOUNTER — Ambulatory Visit (INDEPENDENT_AMBULATORY_CARE_PROVIDER_SITE_OTHER): Admitting: Obstetrics and Gynecology

## 2023-05-24 ENCOUNTER — Ambulatory Visit (INDEPENDENT_AMBULATORY_CARE_PROVIDER_SITE_OTHER)

## 2023-05-24 VITALS — BP 124/86 | HR 72

## 2023-05-24 DIAGNOSIS — N952 Postmenopausal atrophic vaginitis: Secondary | ICD-10-CM

## 2023-05-24 DIAGNOSIS — R102 Pelvic and perineal pain: Secondary | ICD-10-CM

## 2023-05-24 MED ORDER — ESTRADIOL 0.1 MG/GM VA CREA: TOPICAL_CREAM | VAGINAL | 2 refills | Status: DC

## 2023-05-24 NOTE — Patient Instructions (Signed)
 Estradiol  Vaginal Cream What is this medication? ESTRADIOL  (es tra DYE ole) reduces vaginal irritation, dryness, and pain during sex due to menopause. It is an estrogen hormone. This medicine may be used for other purposes; ask your health care provider or pharmacist if you have questions. COMMON BRAND NAME(S): Estrace  What should I tell my care team before I take this medication? They need to know if you have any of these conditions: Abnormal vaginal bleeding Blood vessel disease or blood clots Breast, cervical, endometrial, ovarian, liver, or uterine cancer Dementia Diabetes Gallbladder disease Heart disease or recent heart attack High blood pressure High cholesterol High levels of calcium in the blood Hysterectomy Kidney disease Liver disease Migraine headaches Protein C/S deficiency Stroke Systemic lupus erythematosus (SLE) Tobacco use An unusual or allergic reaction to estrogens, soy, other medications, foods, dyes, or preservatives Pregnant or trying to get pregnant Breast-feeding How should I use this medication? This medication is for use in the vagina only. Do not take by mouth. Follow the directions on the prescription label. Read package directions carefully before using. Use the special applicator supplied with the cream. Wash hands before and after use. Fill the applicator with the prescribed amount of cream. Lie on your back, part and bend your knees. Insert the applicator into the vagina and push the plunger to expel the cream into the vagina. Wash the applicator with warm soapy water and rinse well. Use exactly as directed for the complete length of time prescribed. Do not stop using except on the advice of your care team. A patient package insert for the product will be given with each prescription and refill. Read this sheet carefully each time. The sheet may change frequently. Talk to your care team about the use of this medication in children. This medication is not  approved for use in children. Overdosage: If you think you have taken too much of this medicine contact a poison control center or emergency room at once. NOTE: This medicine is only for you. Do not share this medicine with others. What if I miss a dose? If you miss a dose, use it as soon as you can. If it is almost time for your next dose, use only that dose. Do not use double or extra doses. What may interact with this medication? Do not take this medication with any of the following: Aromatase inhibitors like aminoglutethimide, anastrozole, exemestane, letrozole, testolactone This medication may also interact with the following: Barbiturates used for inducing sleep or treating seizures Carbamazepine Grapefruit juice Medications for fungal infections like ketoconazole and itraconazole Raloxifene Rifabutin Rifampin Rifapentine Ritonavir Some antibiotics used to treat infections St. John's Wort Tamoxifen Warfarin This list may not describe all possible interactions. Give your health care provider a list of all the medicines, herbs, non-prescription drugs, or dietary supplements you use. Also tell them if you smoke, drink alcohol, or use illegal drugs. Some items may interact with your medicine. What should I watch for while using this medication? Visit your care team for regular checks on your progress. You will need a regular breast and pelvic exam. You should also discuss the need for regular mammograms with your care team, and follow their guidelines. This medication can make your body retain fluid, making your fingers, hands, or ankles swell. Your blood pressure can go up. Contact your care team if you feel you are retaining fluid. If you have any reason to think you are pregnant, stop taking this medication at once and contact your care team.  Smoking tobacco increases the risk of getting a blood clot or having a stroke while you are taking this medication, especially if you are older  than 35 years. If you wear contact lenses and notice visual changes, or if the lenses begin to feel uncomfortable, consult your eye care specialist. If you are going to have elective surgery, you may need to stop taking this medication beforehand. Consult your care team for advice prior to scheduling the surgery. What side effects may I notice from receiving this medication? Side effects that you should report to your care team as soon as possible: Allergic reactions--skin rash, itching, hives, swelling of the face, lips, tongue, or throat Blood clot--pain, swelling, or warmth in the leg, shortness of breath, chest pain Breast tissue changes, new lumps, redness, pain, or discharge from the nipple Gallbladder problems--severe stomach pain, nausea, vomiting, fever Increase in blood pressure Liver injury--right upper belly pain, loss of appetite, nausea, light-colored stool, dark yellow or brown urine, yellowing skin or eyes, unusual weakness or fatigue Stroke--sudden numbness or weakness of the face, arm, or leg, trouble speaking, confusion, trouble walking, loss of balance or coordination, dizziness, severe headache, change in vision Unusual vaginal discharge, itching, or odor Vaginal bleeding after menopause, pelvic pain Side effects that usually do not require medical attention (report to your care team if they continue or are bothersome): Bloating Breast pain or tenderness Nausea Vaginal irritation at application site Vomiting This list may not describe all possible side effects. Call your doctor for medical advice about side effects. You may report side effects to FDA at 1-800-FDA-1088. Where should I keep my medication? Keep out of the reach of children and pets. Store at room temperature between 15 and 30 degrees C (59 and 86 degrees F). Protect from temperatures above 40 degrees C (104 degrees C). Do not freeze. Throw away any unused medication after the expiration date. NOTE: This  sheet is a summary. It may not cover all possible information. If you have questions about this medicine, talk to your doctor, pharmacist, or health care provider.  2024 Elsevier/Gold Standard (2020-09-17 00:00:00)  Vaginal Dryness and Thinning (Atrophic Vaginitis): What to Know  Atrophic vaginitis is a condition where the lining of the vagina becomes thin, dry, and inflamed. This condition can make you more likely to: Get an infection. Have pain during sex. Have other uncomfortable symptoms. Tell your health care provider if you notice any changes in your vagina. They can help you find ways to feel better. They can also treat infections and suggest healthy habits for a healthy vagina. What are the causes? Atrophic vaginitis is caused by a drop in estrogen. It's more common when menstrual periods stop during menopause. This often starts between ages 56-55 and may get worse over time as estrogen levels get lower. Estrogen helps to: Keep the vagina moist. Lubricate the vagina during sex. Protect against infection. A drop in estrogen can lead to: Thinner and drier vaginal lining. A smaller, less stretchy vagina. What increases the risk? Many factors make you more likely to have vaginal dryness and thinning. These include: Taking medicines that block estrogen. Having your ovaries taken out. Cancer treatment, such as: Radiation. Chemotherapy. Having recently delivered a baby, especially if not a vaginal delivery. Breastfeeding. Being over age 36. Having an eating disorder. Smoking. What are the signs or symptoms? Pain, soreness, or bleeding during sex. Burning, irritation, or itching around your vagina. Loss of interest in sex. Burning pain while peeing or peeing often. Vaginal  discharge. It may be: White. Misty Odom. Yellow. Blood-tinged. Thick. Watery. Sometimes, there are no symptoms. How is this diagnosed? This condition is diagnosed based on: Your medical history. A pelvic  exam to check the tissue in the vagina. Rarely, you may have more tests. These include: A pee test to check for a urinary tract infection. An acid balance check in the vagina. How is this treated? Treatment depends on your symptoms. Treatment may include: Lubricants for the vagina. Long-acting moisturizers. Low-dose estrogen. These come in: Creams. Tablets. Vaginal rings. Treatment may not be needed if your symptoms are mild and you're not having sex. Talk to your provider about any history of breast cancer, endometrial cancer, or blood clots. Follow these instructions at home: Medicines Take your medicines only as told. Do not use herbal or other medicines unless your provider says it's safe. Use creams, lubricants, or moisturizers only as told. General instructions If your dryness and thinning is related to menopause, talk about your symptoms and treatment options with your provider. Do not douche. Do not use products that make your vagina dry. These include: Scented sprays. Tampons. Soaps. Use water-soluble lubricant or moisturizer if sex is painful. Having sex more often can improve blood flow and make the vaginal tissue more stretchy. Contact a health care provider if: Your discharge from your vagina changes in color and has a smell. You have new symptoms. Your symptoms don't get better with treatment. Your symptoms get worse. This information is not intended to replace advice given to you by your health care provider. Make sure you discuss any questions you have with your health care provider. Document Revised: 08/21/2022 Document Reviewed: 08/21/2022 Elsevier Patient Education  2024 [employer].

## 2023-05-24 NOTE — Progress Notes (Unsigned)
 GYNECOLOGY  VISIT   HPI: 68 y.o.   Married  Caucasian non-Hispanic female   G2P2 with Patient's last menstrual period was 01/24/2003 (approximate).   here for: PUS for pelvic pain.  Symptoms have been burning in the pelvis.    Office visit 05/03/23 for dysuria, frequency and pelvic pressure.  Took course of Macrobid , not helpful for symptoms.  UC negative 05/03/23.   Seen for persistent dysuria and pelvic pressure 05/07/23.  Wet prep negative.  Given a course of Bactrim  DS.  Repeat UC negative 05/07/23.   Still had continued symptoms and took a course of Ciprofloxacin .   Symptoms resolved in 48 hours after starting the medication.   GYNECOLOGIC HISTORY: Patient's last menstrual period was 01/24/2003 (approximate). Contraception: PM Menopausal hormone therapy: Patch/progesterone  Last 2 paps: 2022-WNL, HPV-neg, 07/2021-WNL, HPV- neg History of abnormal Pap or positive HPV: yes, Hx of cryotherapy in her 40's  Mammogram: 11/27/2022-WNL, Cat B        OB History     Gravida  2   Para  2   Term      Preterm      AB      Living  2      SAB      IAB      Ectopic      Multiple      Live Births                 There are no active problems to display for this patient.   Past Medical History:  Diagnosis Date   Abnormal Pap smear of cervix 1999   Cryo   Amenorrhea    Cancer ([facility]) 2009   Sq cell cancer Left face   GERD (gastroesophageal reflux disease)    H/O gastric ulcer    Menorrhagia 3/05   MVP (mitral valve prolapse)    Vitamin D  deficiency 03/2016    Past Surgical History:  Procedure Laterality Date   ABLATION  06/15/03   HTA     Birth Mark Removed     foot   COLPOSCOPY  01/2009   CIN 1   ENDOMETRIAL BIOPSY  03/2003   benign   GYNECOLOGIC CRYOSURGERY  1999   TUBAL LIGATION     BTL    Current Outpatient Medications  Medication Sig Dispense Refill   estradiol  (ESTRACE ) 0.1 MG/GM vaginal cream Use 1/2 g vaginally every night for the first 2  weeks, then use 1/2 g vaginally two or three times per week as needed to maintain symptom relief. 42.5 g 2   Cholecalciferol (VITAMIN D3) 1000 units CAPS Take 1 capsule by mouth daily.     ciprofloxacin  (CIPRO ) 500 MG tablet Take 1 tablet (500 mg total) by mouth 2 (two) times daily. 10 tablet 0   cycloSPORINE (RESTASIS) 0.05 % ophthalmic emulsion 1 drop 2 (two) times daily.     DEXILANT 60 MG capsule Take 1 tablet by mouth daily. Patient takes qod  11   estradiol  (VIVELLE -DOT) 0.0375 MG/24HR APPLY 1 PATCH EXTERNALLY TO THE SKIN 2 TIMES A WEEK 24 patch 3   ibuprofen  (ADVIL ) 800 MG tablet Take 1 tablet (800 mg total) by mouth every 8 (eight) hours as needed. 30 tablet 0   Multiple Vitamin (MULTIVITAMIN ADULT PO) Take by mouth.     phenazopyridine  (PYRIDIUM ) 200 MG tablet Take 1 tablet (200 mg total) by mouth 3 (three) times daily as needed for pain (urethral spasm). 10 tablet 1   progesterone  (PROMETRIUM )  100 MG capsule TAKE 1 CAPSULE(100 MG) BY MOUTH DAILY 90 capsule 3   sulfamethoxazole -trimethoprim  (BACTRIM  DS) 800-160 MG tablet Take 1 tablet by mouth 2 (two) times daily. One PO BID x 3 days 6 tablet 0   tetracycline (SUMYCIN) 250 MG capsule Take 250 mg by mouth daily.     No current facility-administered medications for this visit.     ALLERGIES: Patient has no known allergies.  Family History  Problem Relation Age of Onset   Hypertension Mother    Anemia Mother    Heart disease Mother    Heart disease Father    Cancer Brother        Sage  4 Sarcoma--Dec age 40   Breast cancer Maternal Aunt     Social History   Socioeconomic History   Marital status: Married    Spouse name: Not on file   Number of children: Not on file   Years of education: Not on file   Highest education level: Not on file  Occupational History   Not on file  Tobacco Use   Smoking status: Never   Smokeless tobacco: Never  Vaping Use   Vaping status: Never Used  Substance and Sexual Activity   Alcohol  use: Yes    Alcohol/week: 4.0 standard drinks of alcohol    Types: 4 Standard drinks or equivalent per week    Comment: 1-2 drinks a week (alcohol or wine)   Drug use: No   Sexual activity: Yes    Partners: Male    Birth control/protection: Post-menopausal, Surgical    Comment: BTL, less than 5, after 16 IC, no STD, no abnormal pap  Other Topics Concern   Not on file  Social History Narrative   Not on file   Social Drivers of Health   Financial Resource Strain: Not on file  Food Insecurity: Not on file  Transportation Needs: Not on file  Physical Activity: Not on file  Stress: Not on file  Social Connections: Not on file  Intimate Partner Violence: Not on file    Review of Systems  All other systems reviewed and are negative.   PHYSICAL EXAMINATION:   BP 124/86   Pulse 72   LMP 01/24/2003 (Approximate)   SpO2 99%     General appearance: alert, cooperative and appears stated age  Pelvic US  Uterus 5.31 x 3.06 x 2.30 cm.  0.53 cm posterior intramural fibroid. 4.6 x 3.2 mm echogenic area of cervix.  EMS 1.81 mm.  Symmetrical.  No masses.  Left ovary 1.48 x 0.97 x 1.02 cm.  Atrophic. Right ovary 1.84 x 1.24 x 1.08 cm.  Atrophic.  No adnexal mass.  No free fluid.   ASSESSMENT:  Pelvic pain.  Vaginal atrophy.  Dysuria, resolved.  Subcentimeter fibroid.  Echogenic cervical area.  Non-diagnostic.   PLAN:  Pelvic US  findings reviewed.  Fibroids are considered benign.  No follow up evaluation or treatment is needed.  Vaginal atrophy discussed. Rx for vaginal estradiol  cream.  Benefits and potential risks reviewed including effect on pre-existing breast cancer.  OK to continue HRT.  FU in July for routine exam.  20 min  total time was spent for this patient encounter, including preparation, face-to-face counseling with the patient, coordination of care, and documentation of the encounter.

## 2023-05-28 ENCOUNTER — Encounter: Payer: Self-pay | Admitting: Obstetrics and Gynecology

## 2023-06-05 ENCOUNTER — Other Ambulatory Visit: Payer: Self-pay | Admitting: Obstetrics and Gynecology

## 2023-06-05 NOTE — Telephone Encounter (Signed)
 Medication refill request: progesterone   Last AEX:  08/14/22 Next AEX: 08/21/23 Last MMG (if hormonal medication request): 11/29/22 bi-rads 1 neg  Refill authorized: please advise

## 2023-08-21 ENCOUNTER — Ambulatory Visit: Payer: PPO | Admitting: Obstetrics and Gynecology

## 2023-08-21 ENCOUNTER — Encounter: Payer: Self-pay | Admitting: Obstetrics and Gynecology

## 2023-08-21 VITALS — BP 138/78 | HR 83 | Wt 148.4 lb

## 2023-08-21 DIAGNOSIS — N952 Postmenopausal atrophic vaginitis: Secondary | ICD-10-CM

## 2023-08-21 DIAGNOSIS — Z5181 Encounter for therapeutic drug level monitoring: Secondary | ICD-10-CM

## 2023-08-21 DIAGNOSIS — Z79890 Hormone replacement therapy (postmenopausal): Secondary | ICD-10-CM | POA: Diagnosis not present

## 2023-08-21 MED ORDER — ESTRADIOL 0.0375 MG/24HR TD PTTW: MEDICATED_PATCH | TRANSDERMAL | 3 refills | Status: AC

## 2023-08-21 MED ORDER — PROGESTERONE MICRONIZED 100 MG PO CAPS: ORAL_CAPSULE | ORAL | 3 refills | Status: AC

## 2023-08-21 MED ORDER — ESTRADIOL 0.1 MG/GM VA CREA: TOPICAL_CREAM | VAGINAL | 2 refills | Status: AC

## 2023-08-21 NOTE — Progress Notes (Unsigned)
 GYNECOLOGY  VISIT   HPI: 68 y.o.   Married  Caucasian female   G2P2 with Patient's last menstrual period was 01/24/2003 (approximate).   here for: 1 year follow up- Estrace  & Vivelle , and Progesterone . Having no concerns just needs refills on the estradiol  patch and the progesterone  sent in, going out of town on Friday.   Wants to continue her HRT. Places patch once a week and uses vaginal estrogen cream once a week.   No vaginal bleeding.   No pain with intercourse.  Had pelvic pain, dysuria, and urinary frequency this spring which was treated with antibiotics Bactrim  and then Ciprofloxacin .  Urine cultures were negative.  Wet prep was negative. Patient had a pelvic ultrasound also, which was normal.  Her symptoms were probably attributable to atrophy and urethritis.  Going to [place] this week.   GYNECOLOGIC HISTORY: Patient's last menstrual period was 01/24/2003 (approximate). Contraception:  PMP, BTL Menopausal hormone therapy:  Estrace  cream, Prometrium , & Vivelle  Last 2 paps:  08/10/21 neg HR HPV neg, 08/04/20 neg HR HPV neg  History of abnormal Pap or positive HPV:  yes 07/07/19 ASCUS, HR HPV + Mammogram:  11/27/22 Breast Density Cat B, BIRADS Cat 1 neg         OB History     Gravida  2   Para  2   Term      Preterm      AB      Living  2      SAB      IAB      Ectopic      Multiple      Live Births                 There are no active problems to display for this patient.   Past Medical History:  Diagnosis Date   Abnormal Pap smear of cervix 1999   Cryo   Amenorrhea    Cancer [facility]) 2009   Sq cell cancer Left face   GERD (gastroesophageal reflux disease)    H/O gastric ulcer    Menorrhagia 3/05   MVP (mitral valve prolapse)    Vitamin D  deficiency 03/2016    Past Surgical History:  Procedure Laterality Date   ABLATION  06/15/03   HTA     Birth Mark Removed     foot   COLPOSCOPY  01/2009   CIN 1   ENDOMETRIAL BIOPSY  03/2003    benign   GYNECOLOGIC CRYOSURGERY  1999   TUBAL LIGATION     BTL    Current Outpatient Medications  Medication Sig Dispense Refill   Cholecalciferol (VITAMIN D3) 1000 units CAPS Take 1 capsule by mouth daily.     cycloSPORINE (RESTASIS) 0.05 % ophthalmic emulsion 1 drop 2 (two) times daily.     DEXILANT 60 MG capsule Take 1 tablet by mouth daily. Patient takes qod  11   ibuprofen  (ADVIL ) 800 MG tablet Take 1 tablet (800 mg total) by mouth every 8 (eight) hours as needed. 30 tablet 0   ciprofloxacin  (CIPRO ) 500 MG tablet Take 1 tablet (500 mg total) by mouth 2 (two) times daily. (Patient not taking: Reported on 08/21/2023) 10 tablet 0   estradiol  (ESTRACE ) 0.1 MG/GM vaginal cream Use 1/2 g vaginally two or three times per week as needed to maintain symptom relief. 42.5 g 2   estradiol  (VIVELLE -DOT) 0.0375 MG/24HR APPLY 1 PATCH EXTERNALLY TO THE SKIN 2 TIMES A WEEK 24 patch 3  Multiple Vitamin (MULTIVITAMIN ADULT PO) Take by mouth. (Patient not taking: Reported on 08/21/2023)     phenazopyridine  (PYRIDIUM ) 200 MG tablet Take 1 tablet (200 mg total) by mouth 3 (three) times daily as needed for pain (urethral spasm). (Patient not taking: Reported on 08/21/2023) 10 tablet 1   progesterone  (PROMETRIUM ) 100 MG capsule TAKE 1 CAPSULE(100 MG) BY MOUTH DAILY AT BEDTIME. 90 capsule 3   sulfamethoxazole -trimethoprim  (BACTRIM  DS) 800-160 MG tablet Take 1 tablet by mouth 2 (two) times daily. One PO BID x 3 days (Patient not taking: Reported on 08/21/2023) 6 tablet 0   tetracycline (SUMYCIN) 250 MG capsule Take 250 mg by mouth daily. (Patient not taking: Reported on 08/21/2023)     No current facility-administered medications for this visit.     ALLERGIES: Patient has no known allergies.  Family History  Problem Relation Age of Onset   Hypertension Mother    Anemia Mother    Heart disease Mother    Heart disease Father    Cancer Brother        Sage  4 Sarcoma--Dec age 33   Breast cancer Maternal  Aunt     Social History   Socioeconomic History   Marital status: Married    Spouse name: Not on file   Number of children: Not on file   Years of education: Not on file   Highest education level: Not on file  Occupational History   Not on file  Tobacco Use   Smoking status: Never   Smokeless tobacco: Never  Vaping Use   Vaping status: Never Used  Substance and Sexual Activity   Alcohol use: Yes    Alcohol/week: 4.0 standard drinks of alcohol    Types: 4 Standard drinks or equivalent per week    Comment: 1-2 drinks a week (alcohol or wine)   Drug use: No   Sexual activity: Yes    Partners: Male    Birth control/protection: Post-menopausal, Surgical    Comment: BTL, less than 5, after 16 IC, no STD, no abnormal pap  Other Topics Concern   Not on file  Social History Narrative   Not on file   Social Drivers of Health   Financial Resource Strain: Not on file  Food Insecurity: Not on file  Transportation Needs: Not on file  Physical Activity: Not on file  Stress: Not on file  Social Connections: Not on file  Intimate Partner Violence: Not on file    Review of Systems  See HPI.  PHYSICAL EXAMINATION:   BP 138/78   Pulse 83   Wt 148 lb 6.4 oz (67.3 kg)   LMP 01/24/2003 (Approximate)   SpO2 98%   BMI 25.08 kg/m     General appearance: alert, cooperative and appears stated age Head: Normocephalic, without obvious abnormality, atraumatic Neck: no adenopathy, supple, symmetrical, trachea midline and thyroid  normal to inspection and palpation Lungs: clear to auscultation bilaterally Breasts: normal appearance, no masses or tenderness, No nipple retraction or dimpling, No nipple discharge or bleeding, No axillary or supraclavicular adenopathy Heart: regular rate and rhythm Abdomen: soft, non-tender, no masses,  no organomegaly Extremities: extremities normal, atraumatic, no cyanosis or edema Skin: Skin color, texture, turgor normal. No rashes or lesions Lymph nodes:  Cervical, supraclavicular, and axillary nodes normal. No abnormal inguinal nodes palpated Neurologic: Grossly normal  Pelvic: External genitalia:  no lesions              Urethra:  normal appearing urethra with no masses, tenderness or  lesions              Bartholins and Skenes: normal                 Vagina: normal appearing vagina with normal color and discharge, no lesions              Cervix: no lesions                Bimanual Exam:  Uterus:  normal size, contour, position, consistency, mobility, non-tender              Adnexa: no mass, fullness, tenderness     Chaperone was present for exam:  Kari HERO, CMA  ASSESSMENT:  HRT patient.  Encounter for medication monitoring.   PLAN:  Benefits and risks of HRT reviewed. Benefits include treating vasomotor symptoms, reducing risk of osteoporosis and colon cancer, and improving sense of well being.   Risks of HRT include PE, DVT, MI, stroke and breast cancer.  Dosages and frequency of medications reviewed: Refill of Vivelle  Dot 0.0375 mg twice weekly.  #24, RF 3.  Refill of Prometrium  100 mg q hs.  #90, RF 3.  Refill of vaginal estradiol  cream 1/2 gram 2 - 3 times per week.  Mammogram recommended yearly.  Self breast exam reviewed. FU in one year for routine exam.   20 min  total time was spent for this patient encounter, including preparation, face-to-face counseling with the patient, coordination of care, and documentation of the encounter.

## 2023-09-03 DIAGNOSIS — L719 Rosacea, unspecified: Secondary | ICD-10-CM | POA: Diagnosis not present

## 2023-09-03 DIAGNOSIS — F3340 Major depressive disorder, recurrent, in remission, unspecified: Secondary | ICD-10-CM | POA: Diagnosis not present

## 2023-09-03 DIAGNOSIS — K219 Gastro-esophageal reflux disease without esophagitis: Secondary | ICD-10-CM | POA: Diagnosis not present

## 2023-09-03 DIAGNOSIS — Z131 Encounter for screening for diabetes mellitus: Secondary | ICD-10-CM | POA: Diagnosis not present

## 2023-09-03 DIAGNOSIS — K2270 Barrett's esophagus without dysplasia: Secondary | ICD-10-CM | POA: Diagnosis not present

## 2023-09-03 DIAGNOSIS — Z1331 Encounter for screening for depression: Secondary | ICD-10-CM | POA: Diagnosis not present

## 2023-09-03 DIAGNOSIS — Z0000 Encounter for general adult medical examination without abnormal findings: Secondary | ICD-10-CM | POA: Diagnosis not present

## 2023-09-03 DIAGNOSIS — F411 Generalized anxiety disorder: Secondary | ICD-10-CM | POA: Diagnosis not present

## 2023-09-03 DIAGNOSIS — I10 Essential (primary) hypertension: Secondary | ICD-10-CM | POA: Diagnosis not present

## 2023-09-03 DIAGNOSIS — Z136 Encounter for screening for cardiovascular disorders: Secondary | ICD-10-CM | POA: Diagnosis not present

## 2023-10-30 DIAGNOSIS — R5383 Other fatigue: Secondary | ICD-10-CM | POA: Diagnosis not present

## 2023-10-30 DIAGNOSIS — R0602 Shortness of breath: Secondary | ICD-10-CM | POA: Diagnosis not present

## 2023-10-30 DIAGNOSIS — I341 Nonrheumatic mitral (valve) prolapse: Secondary | ICD-10-CM | POA: Diagnosis not present

## 2023-10-30 DIAGNOSIS — I10 Essential (primary) hypertension: Secondary | ICD-10-CM | POA: Diagnosis not present

## 2024-02-28 ENCOUNTER — Other Ambulatory Visit: Payer: Self-pay | Admitting: Obstetrics and Gynecology

## 2024-02-28 DIAGNOSIS — Z1231 Encounter for screening mammogram for malignant neoplasm of breast: Secondary | ICD-10-CM

## 2024-03-17 ENCOUNTER — Ambulatory Visit

## 2024-08-26 ENCOUNTER — Encounter: Admitting: Obstetrics and Gynecology
